# Patient Record
Sex: Female | Born: 1950 | Race: White | Hispanic: No | Smoking: Never smoker
Health system: Southern US, Community
[De-identification: ages and names within clinical notes are randomized; demographics above are authoritative.]

## PROBLEM LIST (undated history)

## (undated) DIAGNOSIS — F3289 Other specified depressive episodes: Secondary | ICD-10-CM

## (undated) DIAGNOSIS — J302 Other seasonal allergic rhinitis: Secondary | ICD-10-CM

## (undated) DIAGNOSIS — M8580 Other specified disorders of bone density and structure, unspecified site: Secondary | ICD-10-CM

## (undated) DIAGNOSIS — F329 Major depressive disorder, single episode, unspecified: Secondary | ICD-10-CM

## (undated) DIAGNOSIS — B001 Herpesviral vesicular dermatitis: Secondary | ICD-10-CM

## (undated) HISTORY — PX: BUNIONECTOMY: SHX129

## (undated) HISTORY — DX: Herpesviral vesicular dermatitis: B00.1

## (undated) HISTORY — PX: LUMBAR DISC SURGERY: SHX700

## (undated) HISTORY — DX: Other specified depressive episodes: F32.89

## (undated) HISTORY — DX: Major depressive disorder, single episode, unspecified: F32.9

## (undated) HISTORY — DX: Other seasonal allergic rhinitis: J30.2

## (undated) HISTORY — DX: Other specified disorders of bone density and structure, unspecified site: M85.80

---

## 1999-01-23 ENCOUNTER — Other Ambulatory Visit: Admission: RE | Admit: 1999-01-23 | Discharge: 1999-01-23 | Payer: Self-pay | Admitting: Obstetrics & Gynecology

## 2000-02-05 ENCOUNTER — Other Ambulatory Visit: Admission: RE | Admit: 2000-02-05 | Discharge: 2000-02-05 | Payer: Self-pay | Admitting: Obstetrics & Gynecology

## 2000-07-11 ENCOUNTER — Ambulatory Visit (HOSPITAL_COMMUNITY): Admission: RE | Admit: 2000-07-11 | Discharge: 2000-07-11 | Payer: Self-pay | Admitting: Obstetrics & Gynecology

## 2000-07-11 ENCOUNTER — Encounter (INDEPENDENT_AMBULATORY_CARE_PROVIDER_SITE_OTHER): Payer: Self-pay

## 2000-09-16 ENCOUNTER — Encounter (INDEPENDENT_AMBULATORY_CARE_PROVIDER_SITE_OTHER): Payer: Self-pay | Admitting: Specialist

## 2000-09-16 ENCOUNTER — Observation Stay (HOSPITAL_COMMUNITY): Admission: RE | Admit: 2000-09-16 | Discharge: 2000-09-17 | Payer: Self-pay | Admitting: Obstetrics & Gynecology

## 2001-04-22 ENCOUNTER — Other Ambulatory Visit: Admission: RE | Admit: 2001-04-22 | Discharge: 2001-04-22 | Payer: Self-pay | Admitting: Obstetrics & Gynecology

## 2001-10-16 ENCOUNTER — Emergency Department (HOSPITAL_COMMUNITY): Admission: EM | Admit: 2001-10-16 | Discharge: 2001-10-16 | Payer: Self-pay | Admitting: Emergency Medicine

## 2001-10-16 ENCOUNTER — Encounter: Payer: Self-pay | Admitting: Emergency Medicine

## 2002-04-28 ENCOUNTER — Other Ambulatory Visit: Admission: RE | Admit: 2002-04-28 | Discharge: 2002-04-28 | Payer: Self-pay | Admitting: Obstetrics & Gynecology

## 2002-05-13 HISTORY — PX: LAPAROSCOPIC ASSISTED VAGINAL HYSTERECTOMY: SHX5398

## 2002-05-13 HISTORY — PX: BILATERAL SALPINGOOPHORECTOMY: SHX1223

## 2003-05-09 ENCOUNTER — Other Ambulatory Visit: Admission: RE | Admit: 2003-05-09 | Discharge: 2003-05-09 | Payer: Self-pay | Admitting: Obstetrics & Gynecology

## 2004-05-01 ENCOUNTER — Other Ambulatory Visit: Admission: RE | Admit: 2004-05-01 | Discharge: 2004-05-01 | Payer: Self-pay | Admitting: Obstetrics & Gynecology

## 2004-08-24 ENCOUNTER — Encounter: Admission: RE | Admit: 2004-08-24 | Discharge: 2004-08-24 | Payer: Self-pay | Admitting: Orthopedic Surgery

## 2004-10-05 ENCOUNTER — Encounter (INDEPENDENT_AMBULATORY_CARE_PROVIDER_SITE_OTHER): Payer: Self-pay | Admitting: *Deleted

## 2004-10-05 ENCOUNTER — Observation Stay (HOSPITAL_COMMUNITY): Admission: RE | Admit: 2004-10-05 | Discharge: 2004-10-06 | Payer: Self-pay | Admitting: Orthopedic Surgery

## 2004-11-26 ENCOUNTER — Ambulatory Visit: Payer: Self-pay | Admitting: Psychology

## 2004-12-03 ENCOUNTER — Ambulatory Visit: Payer: Self-pay | Admitting: Psychology

## 2004-12-10 ENCOUNTER — Ambulatory Visit: Payer: Self-pay | Admitting: Psychology

## 2004-12-20 ENCOUNTER — Ambulatory Visit: Payer: Self-pay | Admitting: Psychology

## 2004-12-31 ENCOUNTER — Ambulatory Visit: Payer: Self-pay | Admitting: Psychology

## 2005-01-08 ENCOUNTER — Ambulatory Visit: Payer: Self-pay | Admitting: Psychology

## 2005-01-18 ENCOUNTER — Ambulatory Visit: Payer: Self-pay | Admitting: Psychology

## 2005-01-29 ENCOUNTER — Ambulatory Visit: Payer: Self-pay | Admitting: Psychology

## 2005-02-07 ENCOUNTER — Ambulatory Visit: Payer: Self-pay | Admitting: Psychology

## 2005-02-13 ENCOUNTER — Ambulatory Visit: Payer: Self-pay | Admitting: Psychology

## 2005-02-19 ENCOUNTER — Ambulatory Visit: Payer: Self-pay | Admitting: Psychology

## 2005-02-27 ENCOUNTER — Ambulatory Visit: Payer: Self-pay | Admitting: Psychology

## 2005-03-05 ENCOUNTER — Ambulatory Visit: Payer: Self-pay | Admitting: Psychology

## 2005-03-19 ENCOUNTER — Ambulatory Visit: Payer: Self-pay | Admitting: Psychology

## 2005-04-02 ENCOUNTER — Ambulatory Visit: Payer: Self-pay | Admitting: Psychology

## 2005-04-24 ENCOUNTER — Ambulatory Visit: Payer: Self-pay | Admitting: Psychology

## 2005-05-13 HISTORY — PX: BUNIONECTOMY: SHX129

## 2005-05-14 ENCOUNTER — Ambulatory Visit: Payer: Self-pay | Admitting: Psychology

## 2005-05-28 ENCOUNTER — Ambulatory Visit: Payer: Self-pay | Admitting: Psychology

## 2005-06-11 ENCOUNTER — Ambulatory Visit: Payer: Self-pay | Admitting: Psychology

## 2005-06-28 ENCOUNTER — Ambulatory Visit: Payer: Self-pay | Admitting: Psychology

## 2005-07-17 ENCOUNTER — Other Ambulatory Visit: Admission: RE | Admit: 2005-07-17 | Discharge: 2005-07-17 | Payer: Self-pay | Admitting: Obstetrics & Gynecology

## 2005-07-23 ENCOUNTER — Ambulatory Visit: Payer: Self-pay | Admitting: Psychology

## 2005-08-20 ENCOUNTER — Ambulatory Visit: Payer: Self-pay | Admitting: Psychology

## 2005-09-17 ENCOUNTER — Ambulatory Visit: Payer: Self-pay | Admitting: Psychology

## 2005-10-01 ENCOUNTER — Ambulatory Visit: Payer: Self-pay | Admitting: Psychology

## 2005-10-15 ENCOUNTER — Ambulatory Visit: Payer: Self-pay | Admitting: Psychology

## 2005-10-29 ENCOUNTER — Ambulatory Visit: Payer: Self-pay | Admitting: Psychology

## 2005-11-12 ENCOUNTER — Ambulatory Visit: Payer: Self-pay | Admitting: Psychology

## 2005-11-19 ENCOUNTER — Ambulatory Visit: Payer: Self-pay | Admitting: Psychology

## 2005-12-10 ENCOUNTER — Ambulatory Visit: Payer: Self-pay | Admitting: Psychology

## 2005-12-24 ENCOUNTER — Ambulatory Visit: Payer: Self-pay | Admitting: Psychology

## 2006-01-07 ENCOUNTER — Ambulatory Visit: Payer: Self-pay | Admitting: Psychology

## 2006-01-21 ENCOUNTER — Ambulatory Visit: Payer: Self-pay | Admitting: Psychology

## 2006-02-04 ENCOUNTER — Ambulatory Visit: Payer: Self-pay | Admitting: Psychology

## 2006-03-04 ENCOUNTER — Ambulatory Visit: Payer: Self-pay | Admitting: Psychology

## 2006-03-21 ENCOUNTER — Ambulatory Visit: Payer: Self-pay | Admitting: Psychology

## 2006-04-01 ENCOUNTER — Ambulatory Visit: Payer: Self-pay | Admitting: Psychology

## 2006-04-15 ENCOUNTER — Ambulatory Visit: Payer: Self-pay | Admitting: Psychology

## 2006-04-29 ENCOUNTER — Ambulatory Visit: Payer: Self-pay | Admitting: Psychology

## 2006-05-12 ENCOUNTER — Ambulatory Visit: Payer: Self-pay | Admitting: Psychology

## 2006-05-27 ENCOUNTER — Ambulatory Visit: Payer: Self-pay | Admitting: Psychology

## 2006-06-24 ENCOUNTER — Ambulatory Visit: Payer: Self-pay | Admitting: Psychology

## 2006-07-07 ENCOUNTER — Ambulatory Visit: Payer: Self-pay | Admitting: Psychology

## 2006-07-22 ENCOUNTER — Ambulatory Visit: Payer: Self-pay | Admitting: Psychology

## 2006-08-05 ENCOUNTER — Ambulatory Visit: Payer: Self-pay | Admitting: Psychology

## 2006-08-19 ENCOUNTER — Ambulatory Visit: Payer: Self-pay | Admitting: Psychology

## 2006-09-02 ENCOUNTER — Ambulatory Visit: Payer: Self-pay | Admitting: Psychology

## 2006-09-16 ENCOUNTER — Ambulatory Visit: Payer: Self-pay | Admitting: Psychology

## 2006-10-14 ENCOUNTER — Ambulatory Visit: Payer: Self-pay | Admitting: Psychology

## 2006-11-11 ENCOUNTER — Ambulatory Visit: Payer: Self-pay | Admitting: Psychology

## 2006-11-25 ENCOUNTER — Ambulatory Visit: Payer: Self-pay | Admitting: Psychology

## 2007-01-06 ENCOUNTER — Ambulatory Visit: Payer: Self-pay | Admitting: Psychology

## 2007-02-03 ENCOUNTER — Ambulatory Visit: Payer: Self-pay | Admitting: Psychology

## 2007-02-17 ENCOUNTER — Ambulatory Visit: Payer: Self-pay | Admitting: Psychology

## 2007-03-04 ENCOUNTER — Ambulatory Visit: Payer: Self-pay | Admitting: Psychology

## 2007-03-17 ENCOUNTER — Ambulatory Visit: Payer: Self-pay | Admitting: Psychology

## 2007-04-01 ENCOUNTER — Ambulatory Visit: Payer: Self-pay | Admitting: Psychology

## 2007-04-14 ENCOUNTER — Ambulatory Visit: Payer: Self-pay | Admitting: Psychology

## 2007-04-28 ENCOUNTER — Ambulatory Visit: Payer: Self-pay | Admitting: Psychology

## 2007-05-26 ENCOUNTER — Ambulatory Visit: Payer: Self-pay | Admitting: Psychology

## 2007-06-09 ENCOUNTER — Ambulatory Visit: Payer: Self-pay | Admitting: Psychology

## 2007-06-23 ENCOUNTER — Ambulatory Visit: Payer: Self-pay | Admitting: Psychology

## 2007-07-07 ENCOUNTER — Ambulatory Visit: Payer: Self-pay | Admitting: Psychology

## 2007-07-21 ENCOUNTER — Ambulatory Visit: Payer: Self-pay | Admitting: Psychology

## 2007-09-01 ENCOUNTER — Ambulatory Visit: Payer: Self-pay | Admitting: Psychology

## 2007-10-13 ENCOUNTER — Ambulatory Visit: Payer: Self-pay | Admitting: Psychology

## 2007-11-10 ENCOUNTER — Ambulatory Visit: Payer: Self-pay | Admitting: Psychology

## 2007-12-08 DIAGNOSIS — H6990 Unspecified Eustachian tube disorder, unspecified ear: Secondary | ICD-10-CM | POA: Insufficient documentation

## 2007-12-08 DIAGNOSIS — H9209 Otalgia, unspecified ear: Secondary | ICD-10-CM | POA: Insufficient documentation

## 2008-11-30 LAB — HM PAP SMEAR: HM Pap smear: NORMAL

## 2009-08-24 DIAGNOSIS — E559 Vitamin D deficiency, unspecified: Secondary | ICD-10-CM | POA: Insufficient documentation

## 2010-09-28 NOTE — Op Note (Signed)
[facility]  Patient:    Kara [name]                    MRN: [id number] Proc. Date: 09/16/00 Adm. 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 The patient request for definite surgical intervention.  POSTOPERATIVE DIAGNOSES:      Uterine leiomyomata with one large degenerating submucous myoma, dysfunctional uterine bleeding, menorrhagia even in the presence of oral contraceptives. The patient request for definite surgical intervention.  OPERATIVE PROCEDURE:          Laparoscopically assisted vaginal hysterectomy, bilateral salpingo-oophorectomy.  INTRAOPERATIVE COMPLICATION:  Rent in the bladder where scarring had occurred from previous cesarean deliveries.  SECONDARY PROCEDURE:          Repair of rent in bladder and cystoscopy.  SURGEON:                      Freddy Finner, M.D.  ASSISTANT:                    Juluis Mire, M.D.  ANESTHESIA:                   General endotracheal.  ESTIMATED INTRAOPERATIVE BLOOD LOSS:                   Less than or equal to 200 cc.  OTHER INTRAOPERATIVE COMPLICATIONS:                None.  INDICATIONS:                  The patient is a 60 year old white single female who has a significant history of menorrhagia, dysfunctional uterine bleeding which has persisted in spite of conservative treatment with oral contraceptives and a recent hysteroscopy, D&C. She has requested definitive surgical intervention. Is admitted at this time for laparoscopically assisted vaginal hysterectomy and bilateral salpingo-oophorectomy.  INTRAOPERATIVE FINDINGS:      Enlargement to the uterus to approximately [redacted] weeks gestational size and significant scarring of bladder to  cervix encountered on vaginal approach to the procedure. Still photographs of the intra-abdominal findings were recorded and retained in the office record, as well as photographs taken in the bladder at the time of cystoscopy.  The patient was admitted on the morning of surgery. She was placed in TAS hose. She was given a bolus of Cefotan IV preoperatively.  DESCRIPTION OF PROCEDURE:     She was taken to the operating room. There placed under adequate general endotracheal anesthesia. Placed in the dorsal lithotomy position using the [facility] stirrup system. Betadine prep of abdomen, perineum, and vagina was carried out. Bladder was evacuated with a Robinson catheter. Hulka tenaculum was attached to the cervix without difficulty. Sterile drapes were applied to the abdomen. Two small incisions were made in the abdomen, one at the umbilicus through which an 11-mm trocar was introduced while elevating the anterior abdominal wall manually. No injury was encountered on entry. Pneumoperitoneum was allowed to accumulate. A second trocar was placed through a small incision just above the symphysis at the site of the old c-section scar.  Systematic examination of abdominal and pelvic contents was carried out with no apparent abnormalities in the upper abdomen. The uterus itself was retroverted and enlarged to approximately 9 week size. With traction on the Hulka tenaculum, it was felt that adequate descensus could be accomplished to perform the procedure vaginally; and for that reason, no dissection was done through the laparoscope.  Gas was allowed to escape from the abdomen. Trocar sleeves were left in place. Attention was then turned vaginally. A posterior weighted vaginal retractor was placed. Cervix was grasped with a Christella Hartigan tenaculum. Mucosa posterior to the cervix was tented with an Allis, and the mucosa incised with Mayo scissors entering the posterior peritoneal cul-de-sac. Cervix was  circumscribed with a scalpel. The ligature system was then used to develop the uterosacral pedicles, the bladder pillar pedicles, the cardinal ligament pedicles. In the process, the bladder was carefully dissected off the cervix. After securing the cardinal ligament pedicles, further attempts were made to very carefully sharply and bluntly dissect the bladder off the cervix; and in the process of doing so, an approximately 2 cm incision was made into the base of the bladder which became immediately obvious because of spillage of urine into the field. Having identified the bladder, further careful sharp dissection was carried out to elevate the bladder off of the cervix and lower uterine segment, and the anterior peritoneal fold was entered. After accomplishing this, the hysterectomy portion was continued using the ligature clamps with vessel pedicles and a pedicle on each side above the vessels being controlled in this way and divided sharply. The uterus was then delivered through the vaginal introitus. The utero-ovarian pedicles were clamped on each side with curved Heaneys, and the uterus itself removed. The infundibulopelvic ligaments were then isolated and controlled with the ligature system, one placement on each side which was then doubly fulgurated and divided removing both the tubes and the ovaries. The bladder defect was then carefully identified and grasped with Allis clamps. A two-layer closure was then accomplished using 3-0 chromic suture on a GI needle. Cystoscopy was then performed, and the patient was given IV methylene blue. The defect in the base of the bladder which had been repaired was easily identified with the hysteroscope. Both ureteral orifices were identified, and blue dye was noted and photographed to be coming from each ureter. The bladder was then deflated, and reinspection vaginally revealed a bleb at the level of the repair which was thought to be secondary to  rupture of the second layer closure or during the cystoscopy. This was closed again using 3-0 chromic suture, and the bladder was not inflated again.  There was no leakage after a complete closure. A Silastic Foley was placed and will be left in place for approximately 10 days postoperatively. The angles of the vagina were then anchored to uterosacral pedicles with a mattress suture of 0 Monocryl. Uterosacrals were plicated. Posterior peritoneum closed with a 0 Monocryl. Cuff was closed vertically. Reinspection laparoscopically was then carried out. Minimal bleeding sources were noted along the peritoneal dissected surfaces particularly on the left side, and these were easily controlled with the bipolar Clevenger forceps which were used through the operating channel of the laparoscope. A small hematoma at the infundibulopelvic ligament on the left side was also cauterized with a bipolar forceps and was stable. There was no intra-abdominal bleeding on reduced intra-abdominal pressure after careful inspection. The procedure at this point was terminated. All the gas was allowed to escape from the abdomen. The abdominal  instruments were removed. The skin incisions were closed with interrupted subcuticular sutures of 3-0 Dexon. Steri-Strips were applied to the lower incision. The patient was awakened and taken to recovery in good condition. DD:  09/17/00 TD:  09/17/00 Job: [id number]

## 2010-09-28 NOTE — Op Note (Signed)
[facility]  Patient:    Kara [name]                      MRN: [id number] Proc. Date: 07/11/00 Attending:  Freddy Finner, M.D.                           Operative Report  PREOPERATIVE DIAGNOSIS:       1. Menorrhagia.                               2. Dysfunctional uterine bleeding.                               3. Ultrasound consistent with degenerating                                  submucous myoma.  POSTOPERATIVE DIAGNOSIS:      Small degenerating submucous myoma or                               sessile polyp.  OPERATION:                    Hysteroscopy dilatation and curettage with resection of visible mass in the upper fundus in the endometrial cavity.  SURGEON:                      Freddy Finner, M.D.  ANESTHESIA:                   Intravenous sedation.  ESTIMATED BLOOD LOSS:         Less than 20 cc.  SORBITOL DEFICIT:             50 cc.  INDICATIONS:                  The patient is a 59 year old who has had a distinctly abnormal period and who has had a long history of menorrhagia.  The uterus is palpable enlarged clinically to approximately 8-weeks size.  Pelvic ultrasound was obtained in the office in the recent past and showed an approximately 2 cm lesion consistent with degenerating myoma.  She is admitted now for hysteroscopy D&C.  INTRAOPERATIVE FINDINGS:      Recorded on still photographs.  There was what appeared to be either submucous myoma or sessile polyp of the superior endometrial surface of the upper fundus.  This was the only visible abnormality, although there was a rather lush appearing endometrium. Photographs are retained in the office record.  DESCRIPTION OF PROCEDURE:     The patient was admitted on the morning of surgery, brought to the operating room, placed under adequate intravenous sedation, placed in the dorsolithotomy position.  Sedation was adequate for complete analgesia, and no paracervical block  was placed.  The cervix was visualized using a bivalve speculum with Betadine prep and application of sterile drapes. Cervix was grasped with single-tooth tenaculum.  Uterus sounded to approximately 10 cm.  The 12.5 degree hysteroscope was introduced after dilating the cervix to a 23 with Pratts.  The lesion was visualized. After a careful inspection, gentle and thorough curettage was carried out using Heaney curet,  a sharp curet, and Randall stone forceps were used to retrieve the material from the cavity.  On reinspection, the lesion visible was absent.  It was felt that it probably represented either a thickening of the endometrium, a small sessile polyp, or a degenerating myoma which was easily curetted away. The resectoscope was not required.  The procedure at this point was terminated.  The instruments were removed.  The patient was taken to the recovery room in good condition.  She will be given routine outpatient surgical instructions. DD:  07/11/00 TD:  07/11/00 Job: [id number]

## 2010-09-28 NOTE — H&P (Signed)
[facility] [place]  Patient:    Kara [name]                    MRN: [id number] Adm. Date:  [id number] Disc. Date: [id number] Attending:  Minette Headland                         History and Physical  ADMITTING DIAGNOSES:          Uterine leiomyomata, probable degenerating submucous myoma, menorrhagia even on oral contraceptives and after hysteroscopy/dilatation and curettage done in March 2002.                                Patient is a 60 year old white single female gravida 2, para 2 by cesarean who has been followed in my office since 1999 with a history of menorrhagia when not on oral contraceptives initially.  On review of medical records from [place] her examination had been found to be normal in the past and on initial evaluation in September 1999 pelvic examination seemed to be normal to my examination.  Over the interval since that time she has continued with regular followup.  She has been maintained on oral contraceptives but the uterus has become slightly enlarged and irregular and on pelvic ultrasound in February 2002 she was found to have at least three myomas one of which seemed to abut on the endometrial cavity.  Subsequent to that examination she had hysteroscopy/D&C with what appeared to be a sessile polyp or small submucous myoma which was resected hysteroscopically.  The patient has continued to have menorrhagia and dysmenorrhea and has now requested definitive surgical intervention.  She is admitted now for laparoscopically assisted vaginal hysterectomy, bilateral salpingo-oophorectomy.  REVIEW OF SYSTEMS:            Otherwise negative.  No cardiopulmonary, GI, GU complaints.  PAST MEDICAL HISTORY:         Significant for migraine headache.  Her migraines are described as perimenstrual.  She has anxiety disorder.  She has no other known significant medical illnesses.  PAST SURGICAL HISTORY:        Two cesarean deliveries,  hysteroscopy/D&C noted above.  She has also previously had bunions excised from her feet.  ALLERGIES:                    CODEINE.  SOCIAL HISTORY:               She does not use cigarettes or alcohol.  She has never had a blood transfusion.  FAMILY HISTORY:               Noncontributory.  PHYSICAL EXAMINATION  HEENT:                        Grossly within normal limits.  NECK:                         Thyroid gland is not palpably enlarged.  CHEST:                        Clear to auscultation.  HEART:                        Normal sinus rhythm with a grade 2/6 early systolic murmur.  No rubs, gallops, or other murmurs are heard.  BREASTS:                      Considered to be normal.  No palpable masses. No nipple discharge.  No skin change.  ABDOMEN:                      Soft and nontender without appreciable organomegaly or palpable masses.  EXTREMITIES:                  Without cyanosis, clubbing, or edema.  PELVIC:                       Described above.  On most recent examination bimanual revealed the uterus to be retroverted position and 9-10 weeks size which would suggest enlargement of fibroids.  ASSESSMENT:                   Uterine leiomyomata, menorrhagia now even on oral contraceptives and after conservative surgery.  PLAN:                         Laparoscopically assisted vaginal hysterectomy, bilateral salpingo-oophorectomy. DD:  09/15/00 TD:  09/15/00 Job: [id number]

## 2010-09-28 NOTE — Op Note (Signed)
NAMEAdela [name]            ACCOUNT NO.:  [id number]   MEDICAL RECORD NO.:  [id number]          PATIENT TYPE:  OBV   LOCATION:  8119                         FACILITY:  [facility]   PHYSICIAN:  Marlowe Kays, M.D.  DATE OF BIRTH:  1950/06/16   DATE OF PROCEDURE:  10/05/2004  DATE OF DISCHARGE:                                 OPERATIVE REPORT   PREOPERATIVE DIAGNOSES:  1.  Central and lateral recess stenosis.  2.  Herniated nucleus pulposus, L4-5, right.   POSTOPERATIVE DIAGNOSES:  1.  Central and lateral recess stenosis.  2.  Herniated nucleus pulposus, L4-5, right.   OPERATION:  Decompressive microdiskectomy, L4-5, right.   SURGEON:  Marlowe Kays, M.D.   ASSISTANT:  Georges Lynch. Darrelyn Hillock, M.D.   ANESTHESIA:  General.   INDICATIONS FOR PROCEDURE:  She is having right leg pain with a monogram CT  scan demonstrating a defect at L4-5 on the right, both in the AP and lateral  projections with effacement of the L5 nerve root.  CT scan demonstrated disk  herniation, foraminal mainly, at L4-5 on the right.  Accordingly, because of  persistent pain, she is here today for the above-mentioned surgery.   PROCEDURE:  Prophylactic antibiotics.  Satisfactory general anesthesia.  Knee-chest position on the [place] frame.  The back was prepped with  DuraPrep and with three spinal needles on lateral x-ray, we tentatively  localized the L4-5 disk.  I then continued draping the back in the sterile  field.  Ioban employed.  A vertical midline incision.  The two spinous  processes, which I thought were L4-5, were isolated and tagged with Kocher  clamps.  A second lateral x-ray was taken, confirming that we were on the  spinous processes of L4-5 with the disk space close to the superior clamp.  Based on this, I continued dissecting soft tissue off the lamina of L4-5 and  removed, with double-action rongeur, a good bit of the inferior lamina of L4  to allow exposure to the interspace.  I then  completed the decompression  with a combination of 3 and 2 mm Kerrison rongeurs until we had gotten down  to the more delicate decompression.  At that point, brought in the  microscope and completed decompression, removing the ligamentum flavum and  additional bone laterally, performing wide foraminotomy.  We then were able  to get lateral to the dura and L5 nerve root and mobilize it medially.  A  large disk herniation was noted, and I opened it with a 15 knife blade.  I  then used a combination of Epstein curettes and pituitary rongeurs to remove  all disk material obtainable from the interspace.  Potential bleeders were  coagulated with bipolar cautery.  At the conclusion of the decompression,  the L5 nerve root was lying free, both laterally and in the foramen.  There  did not appear to be any free fragments of disk material, and the wound was  dry.  I irrigated the wound well with sterile saline, placed Gelfoam over  the interspace and around the L5 nerve root and over the dura.  A self-  retaining McCullough retractor was removed.  There was no unusual bleeding.  The ligamentum flavum was closed with interrupted #1 Vicryl, and  subcutaneous tissue infiltrated with 0.5% plain Marcaine.  She was given 30  mg of Toradol IV.  Closure was completed with interrupted  2-0 Vicryl to the subcutaneous tissue and staples to the skin.  Betadine  Adaptic and dry sterile dressing were applied.  She tolerated the procedure  well and was taken to the recovery room in satisfactory condition with no  known complications.      JA/MEDQ  D:  10/05/2004  T:  10/05/2004  Job:  [id number]

## 2010-09-28 NOTE — Discharge Summary (Signed)
[facility] [place]  Patient:    Kara [name]                    MRN: [id number] Adm. Date:  [id number] Disc. Date: 09/17/00 Attending:  Minette Headland                           Discharge Summary  DISCHARGE DIAGNOSES:          Uterine leiomyomata with submucous degenerating myoma, dysfunctional uterine bleeding, and menorrhagia.  OPERATIVE PROCEDURE:          Laparoscopically assisted vaginal hysterectomy, repair of rent in bladder.  CONDITION ON DISCHARGE:       Good.  ACTIVITIES:                   Progressively increasing with no vaginal entry, no heavy lifting.  INSTRUCTIONS:                 The patient is instructed to call for fever above 100; to call for any significant watery, vaginal drainage; to call for any significant amount of vaginal bleeding. She is to use sitz baths or showers at her discretion at home.  FOLLOW-UP:                    She is to return to the office in approximately 10 to 12 days for her first postoperative visit.  DISCHARGE MEDICATIONS:        1. Cenestin 1.25 mg daily.                               2. Ambien to be taken 10 mg p.o. h.s. p.r.n.                              sleep.                               3. Darvocet to be taken one or two every 6                                  hours as needed for postoperative pain.                               4. She is to use Uristat as needed for                                  postoperative urinary tract discomfort.                               5. She is to take Ceftin 250 mg b.i.d. until her                                  follow-up visit in the office in 10 to 12  days.  Details of the present illness, past history, family history, review of systems, and physical exam recorded in the admission note. The patient was admitted on the morning of surgery for the above-described operative procedure. Her physical findings on admission  were remarkable only for enlargement of the uterus. The degenerating nature of the fibroid was determined by ultrasound.  LABORATORY DATA:              During this admission includes urinalysis on admission which was normal; prothrombin time, PTT, and INR, all of which were normal on admission. Admission CBC with white count of 6.6, hemoglobin of 10.6, platelet count 242,000. Postoperative white count was 10,000, hemoglobin 9.4, platelet count 205.  HOSPITAL COURSE:              The patient was admitted on the morning of surgery. She received a gram of Cefotan IV preoperatively. She was taken to the operating room where the above-described procedure was accomplished. Following the entry into the bladder, the defect was repaired, and cystoscopy revealed adequate repair with no impingement or injury to ureters as evidenced by free spillage of indigo carmen in the urine from each ureteral orifice. Postoperatively, she remained afebrile throughout her postoperative stay. By approximately 30 hours postop, she was ambulating without difficulty. She was having adequate bowel function with positive flatus and tolerating a regular diet. She has her Foley in place with leg bag present which is to continue until her follow-up in the office. Her condition was considered to be satisfactory, and she was discharged home with disposition as noted above. DD:  09/17/00 TD:  09/17/00 Job: [id number]

## 2011-04-16 LAB — HM PAP SMEAR: HM Pap smear: NORMAL

## 2011-04-16 LAB — HM MAMMOGRAPHY: HM Mammogram: NORMAL

## 2011-07-24 ENCOUNTER — Ambulatory Visit (INDEPENDENT_AMBULATORY_CARE_PROVIDER_SITE_OTHER): Payer: 59 | Admitting: Family Medicine

## 2011-07-24 ENCOUNTER — Encounter: Payer: Self-pay | Admitting: Family Medicine

## 2011-07-24 VITALS — BP 112/82 | HR 88 | Ht 63.0 in | Wt 153.0 lb

## 2011-07-24 DIAGNOSIS — M79609 Pain in unspecified limb: Secondary | ICD-10-CM

## 2011-07-24 DIAGNOSIS — R209 Unspecified disturbances of skin sensation: Secondary | ICD-10-CM

## 2011-07-24 DIAGNOSIS — F411 Generalized anxiety disorder: Secondary | ICD-10-CM

## 2011-07-24 NOTE — Progress Notes (Signed)
Patient presents to establish care with chief complaint of her L pointer finger--tip turns ice cold and tingles.  Denies any color change.  This morning, tingling was very intense, and was very cold.  Also feels pressure in front of her elbow/antecubital fossa.  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  Denies any recent problem with carpal tunnel.  Not related to position or activity.  She is left handed, and on computer all day. Denies any weakness, color change.  She reports being under a lot of work stress.  She is on Cymbalta from her GYN, since her hysterectomy. Moods overall are okay.  Wants to come down on her Cymbalta dose to 30mg  (she reports it was increased to 60 mg in error--given wrong samples). Gets through Julio Sicks at her GYN's office.  Has been on 60 mg for at least 4-5 months, feels a little dizzy.  Some trouble sleeping, 2-3 times/week, gets up every hour, thinking about work.  Admits to not exercising as much over the winter, but is back to walking 3 miles every other day.  Uses xanax 1-2x/week over the last 3 weeks, when she feels like there is an elephant sitting on her chest.  She denies any exertional chest pain or shortness of breath.  Past Medical History  Diagnosis Date  . Depressive disorder, not elsewhere classified     situational (divorce, work stress)  . Seasonal allergies   . Herpes simplex labialis     Past Surgical History  Procedure Date  . Laparoscopic assisted vaginal hysterectomy 2004  . Bilateral salpingoophorectomy 2004    done with hysterectomy  . Cesarean section x2  . Lumbar disc surgery     History   Social History  . Marital Status: Divorced    Spouse Name: N/A    Number of Children: 2  . Years of Education: N/A   Occupational History  . contracting (for physicians) [employer]   Social History Main Topics  . Smoking status: Never Smoker   . Smokeless tobacco: Never Used  . Alcohol Use: Yes     2 glasses of wine per evening.  .  Drug Use: No  . Sexually Active: Not on file   Other Topics Concern  . Not on file   Social History Narrative   Divorced.  Son is a paramedic in [employer].  Daughter and her 2 children are currently living with her    Family History  Problem Relation Age of Onset  . Heart disease Mother     diagnosed in her 73's.  s/p CABG  . Cancer Mother 57    breast cancer  . Alcohol abuse Father   . Diabetes Father     refused to be treated  . COPD Sister   . Diabetes Sister   . Diabetes Brother   . Cerebral aneurysm Maternal Aunt   . Heart disease Maternal Uncle   . Cancer Maternal Grandmother     lymphoma  . Heart disease Maternal Grandfather   . Diabetes Brother   . Alcohol abuse Brother     stopped when diagnosed with DM    Current outpatient prescriptions:ALPRAZolam (XANAX) 0.25 MG tablet, Take 0.25 mg by mouth as needed., Disp: , Rfl: ;  DULoxetine (CYMBALTA) 60 MG capsule, Take 60 mg by mouth daily., Disp: , Rfl:   Allergies  Allergen Reactions  . Codeine Other (See Comments)    Chills,vomiting and hallucinations.  . Lyrica Other (See Comments)    Hallucinations.  ROS:  Denies fevers, URI symptoms, cough, shortness of breath, nausea, vomiting, stool changes, urinary symptoms, skin lesions or rashes, headaches, dizziness or other concerns.  See HPI  PHYSICAL EXAM BP 112/82  Pulse 88  Ht 5\' 3"  (1.6 m)  Wt 153 lb (69.4 kg)  BMI 27.10 kg/m2 Well developed, pleasant female in no distress HEENT: PERRL, conjunctiva clear Neck: no lymphadenopathy or thyromegaly Heart: regular rate and rhythm without murmur Lungs: clear bilaterally Abdomen: soft, nontender, no mass Extremities: no clubbing, cyanosis or edema, 2+ pulses.  Brisk capillary refill in fingertips.  Negative Tinel.  Normal strength, sensation Skin: no rash Psych: normal mood, affect, hygiene and grooming  ASSESSMENT/PLAN: 1. Paresthesia and pain of extremity   2. Anxiety state, unspecified    Tingling and cold  sensation in L index fingertip--most likely due to carpal tunnel syndrome.  Doubt Raynaud's phenomenon due to lack of color change.  Reassured no evidence of circulatory problem.  Trial of wrist brace and NSAID.  F/u if symptoms persist, worsen  Anxiety--given current work stress, recommended that she continue the Cymbalta, rather than decreasing dose.  If ongoing regular need for xanax, consider increasing dose.  Discussed stress reduction, exercise.  Recommended cutting back to just one glass of wine daily--risks reviewed.  Schedule CPE.  Recommended checking Zostavax coverage, and likely will need TdaP.  Will get records released

## 2011-07-24 NOTE — Patient Instructions (Signed)
Cut back on alcohol to 1 glass daily.  Try to increase exercise and work on Brewing technologist.  Consider yoga, pilates.  Get previous records, and set up physical.  Check on insurance coverage for Zostavax (shingles vaccine) and plan to get TdaP (tetanus booster with pertussis)

## 2011-08-22 ENCOUNTER — Telehealth: Payer: Self-pay | Admitting: Family Medicine

## 2011-08-22 NOTE — Telephone Encounter (Signed)
Pt called around 11:00ish, she told Olegario Messier she had a mental breakdown and work and wanted to see someone.  I went and discussed with Dr. Lynelle Doctor as Dr Delford Field schedule was full.  I questioned pt did they have any employee assistance programs or therepist and she said no.  I advised her both docs schedules were full we could see her tomorrow or she could go the [facility] as they have a behavioral health er.  I also advised her if I had any cancellations we would call her back.

## 2011-08-23 ENCOUNTER — Encounter: Payer: Self-pay | Admitting: Medical

## 2011-08-23 ENCOUNTER — Ambulatory Visit (INDEPENDENT_AMBULATORY_CARE_PROVIDER_SITE_OTHER): Payer: 59 | Admitting: Medical

## 2011-08-23 VITALS — BP 142/90 | HR 88 | Temp 98.7°F | Resp 16 | Wt 152.0 lb

## 2011-08-23 DIAGNOSIS — K2970 Gastritis, unspecified, without bleeding: Secondary | ICD-10-CM

## 2011-08-23 DIAGNOSIS — F430 Acute stress reaction: Secondary | ICD-10-CM

## 2011-08-23 DIAGNOSIS — F410 Panic disorder [episodic paroxysmal anxiety] without agoraphobia: Secondary | ICD-10-CM

## 2011-08-23 DIAGNOSIS — K2990 Gastroduodenitis, unspecified, without bleeding: Secondary | ICD-10-CM

## 2011-08-23 DIAGNOSIS — F4389 Other reactions to severe stress: Secondary | ICD-10-CM

## 2011-08-23 DIAGNOSIS — F438 Other reactions to severe stress: Secondary | ICD-10-CM

## 2011-08-23 MED ORDER — SERTRALINE HCL 25 MG PO TABS: ORAL_TABLET | ORAL | Status: DC

## 2011-08-23 MED ORDER — OMEPRAZOLE 20 MG PO CPDR: 20.0000 mg | DELAYED_RELEASE_CAPSULE | Freq: Every day | ORAL | Status: DC

## 2011-08-23 NOTE — Patient Instructions (Signed)
Anxiety We are going to have you wean off the Cymbalta as follows:  Start taking Cymbalta 30mg  samples, daily for 5 days.  After 5 days, take Cymbalta 30mg  every other day for a week, then stop.  Simultaneously, begin Zoloft 25mg , 1/2 tablet daily for the first week, then go to 1 whole table daily after 1 week.   Stomach pain Begin Prilosec OTC or samples, 45 minutes before breakfast.    Lets have you follow up here in 2 weeks for recheck.    Anxiety and Panic Attacks Your caregiver has informed you that you are having an anxiety or panic attack. There may be many forms of this. Most of the time these attacks come suddenly and without warning. They come at any time of day, including periods of sleep, and at any time of life. They may be strong and unexplained. Although panic attacks are very scary, they are physically harmless. Sometimes the cause of your anxiety is not known. Anxiety is a protective mechanism of the body in its fight or flight mechanism. Most of these perceived danger situations are actually nonphysical situations (such as anxiety over losing a job). CAUSES  The causes of an anxiety or panic attack are many. Panic attacks may occur in otherwise healthy people given a certain set of circumstances. There may be a genetic cause for panic attacks. Some medications may also have anxiety as a side effect. SYMPTOMS  Some of the most common feelings are:  Intense terror.   Dizziness, feeling faint.   Hot and cold flashes.   Fear of going crazy.   Feelings that nothing is real.   Sweating.   Shaking.   Chest pain or a fast heartbeat (palpitations).   Smothering, choking sensations.   Feelings of impending doom and that death is near.   Tingling of extremities, this may be from over-breathing.   Altered reality (derealization).   Being detached from yourself (depersonalization).  Several symptoms can be present to make up anxiety or panic attacks. DIAGNOSIS    The evaluation by your caregiver will depend on the type of symptoms you are experiencing. The diagnosis of anxiety or panic attack is made when no physical illness can be determined to be a cause of the symptoms. TREATMENT  Treatment to prevent anxiety and panic attacks may include:  Avoidance of circumstances that cause anxiety.   Reassurance and relaxation.   Regular exercise.   Relaxation therapies, such as yoga.   Psychotherapy with a psychiatrist or therapist.   Avoidance of caffeine, alcohol and illegal drugs.   Prescribed medication.  SEEK IMMEDIATE MEDICAL CARE IF:   You experience panic attack symptoms that are different than your usual symptoms.   You have any worsening or concerning symptoms.  Document Released: 04/29/2005 Document Revised: 01/09/2011 Document Reviewed: 08/31/2009 [facility] Patient Information 2012 [place], [place].    Stroke (Cerebrovascular Accident)  SIGNS AND SYMPTOMS OF STROKE These symptoms usually develop suddenly (or may be newly present upon awakening from sleep):  Sudden weakness or numbness of the face, arm, or leg, especially on one side of the body.   Sudden confusion.   Trouble speaking (aphasia) or understanding.   Sudden trouble seeing in one or both eyes.   Sudden trouble walking.   Dizziness.   Loss of balance or coordination.   Sudden severe headache with no known cause.   SEEK IMMEDIATE MEDICAL CARE IF:   You have sudden weakness or numbness of the face, arm, or leg, especially on  one side of the body.   You have sudden confusion.   You have trouble speaking or understanding.   You have sudden trouble seeing in one or both eyes.   You have sudden trouble walking.   You have dizziness.   You have a loss of balance or coordination.   You have a sudden severe headache with no known cause.   You have a fever.   You are coughing or have difficulty breathing.   You have new chest pain, angina, or an  irregular heartbeat.  ANY OF THESE SYMPTOMS MAY REPRESENT A SERIOUS PROBLEM THAT IS AN EMERGENCY. Do not wait to see if the symptoms will go away. Get medical help right away. Call your local emergency services (911 in [place] DO NOT drive yourself to the hospital.  TREATMENT  TIME IS OF THE ESSENCE! It is important to seek treatment within 4 hours of the start of symptoms because you may receive a "clot dissolving" medication that cannot be given after that time. Even if you don't know when your symptoms began, get treatment as soon as possible.    WHAT IS A STROKE: A stroke is the sudden death of brain tissue. It is a medical emergency. A stroke can cause permanent loss of brain function. This can cause problems with different parts of your body. A TIA (transient ischemic attack) is different because it does not cause permanent damage. A TIA is a short-lived problem of poor blood flow affecting a part of the nervous system. TIA is also a serious problem because having a TIA greatly increases the chances of having a stroke. When symptoms first develop, you cannot know if the problem might be a stroke or TIA. CAUSES  A stroke is caused by a decrease of oxygen supply to an area of your brain. It is usually the result of a small blood clot or the arteries hardening. A stroke can also be caused by blocked or damaged carotid arteries. Bleeding in the brain can cause, or accompany, a stroke.  RISK FACTORS  High blood pressure (hypertension).   High cholesterol.   Diabetes.   Heart disease.   The buildup of fatty deposits in the blood vessels (peripheral artery disease or atherosclerosis).   An abnormal heart rhythm (atrial fibrillation).   Obesity.   Smoking.   Taking oral contraceptives (especially in combination with smoking).   Physical inactivity.   A diet high in fats, salt (sodium), and calories.   Alcohol use.   Use of illegal drugs (especially cocaine and methamphetamine).     Being a female.   Being an Tree surgeon.   Age over 50.   Family history of stroke.   Previous history of blood clots, a "warning stroke" (transient ischemic attack, TIA), or heart attack.   Sickle cell disease.

## 2011-08-23 NOTE — Progress Notes (Signed)
Subjective: Here for concerns.  She normally sees Dr. Lynelle Doctor here, and in the past has been treated with Zoloft and Cymbalta through her gynecologist.  She tried to get in with both this week, but scheduling didn't permit, so she is here to see me today.  She notes that she has been going through rough time at work with Production designer, theatre/television/film at work.   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  About a year ago her current manager gave her a review showing "needs improvement."  Since that time she has used the suggestions of her manager to improve although she felt as though she has done a great job.  She had asked for a 43mo review for followup on her improvements but never got the review.   Then on 03/2011, she was placed on a "corrective action" plan much to her amazement.  This really started to stress her as she felt that her supervisor had it out for her, wasn't acknowledging the improvements she had made despite doing what the supervisor asked, and still hadn't been given the review.   She felt as though the supervisor is making up negative job performance comments about her despite being given fair review.   She has went to HR and the VP about this issue, and basically feels like the 2 sides won't work together - HR,VP verus her Merchandiser, retail.  They have acknowledged that her direct supervisor has the authority to fire her or move her to a different dept, although Mrs. Hinderer has been unable to get the supervisor to hear her out or make any changes on her behalf.  Thinks really came to breaking point last week when again to her surprise she was given a "final warning" on her job performance.  She really thinks issue has been either from her impression that her supervisor either has her select group of folks she favors or either is trying to make her job miserable as a way to force her out of the company prematurely.  She has been  there 14 years and wants to continue working there, but feels hostage to her supervisor.    When she was given the "final warning," she had to leave and go to the parking lot as she was having a panic attack.  She also felt her stomach on fire, felt a knot in the epigastric area, pain in her side x 10 days, burning like stomach lining on fire, feels like she is constipated.   She called her supervisor from her car and advise on how this stress had been affecting her psyche but now its affection her physically too and she can't live this way.  She said the supervisor told her to go see a psychiatrist and get help.  She since awakes clutching her self, pressure in top of head, and Wednesday felt her face draw up like it wanted to become numb.   This scared her.  She is here for help on the anxiety and panic as well as the new physical manifestations. She notes being put on Zoloft after prior hysterectomy and did really well on it. She wants to come off Cymbalta as it doesn't seem to help in any way.  She has used xanax prior for panic attack but this sedates her and she doesn't like taking it.  She notes documenting the last years' worth of situations at work that  she feels is unfair.  She is now afraid she will need to seek an attorney as a last resort as she can't get her supervisor or the higher ups to help sort the issues out or give her a review.  Past Medical History  Diagnosis Date  . Depressive disorder, not elsewhere classified     situational (divorce, work stress)  . Seasonal allergies   . Herpes simplex labialis    ROS as noted in HPI   Objective:   Physical Exam  Filed Vitals:   08/23/11 1340  BP: 142/90  Pulse: 88  Temp: 98.7 F (37.1 C)  Resp: 16    General appearance: alert, no distress, WD/WN, crying at times HEENT: normocephalic, sclerae anicteric, TMs pearly, nares patent, no discharge or erythema, pharynx normal Oral cavity: MMM, no lesions Neck: supple, no  lymphadenopathy, no thyromegaly, no masses Heart: RRR, normal S1, S2, no murmurs Lungs: CTA bilaterally, no wheezes, rhonchi, or rales Abdomen: +bs, soft, non tender, non distended, no masses, no hepatomegaly, no splenomegaly Pulses: 2+ symmetric Neuro: CN2-12 intact, normal strength and sensation, DTrs normal, non focal exam   Assessment and Plan :     Encounter Diagnoses  Name Primary?  . Acute stress reaction Yes  . Gastritis   . Panic attack    Discussed her concerns, counseled on strategies to deal with both panic, anxiety, and dealing with her supervisors and these issues.  She reports no benefit from Cymbalta.  Will wean off Cymbalta and begin Zoloft low dose.  Gave her instructions on how to wean off cymbalta and starting Zoloft.  Can c/t Xanax prn.  Recheck 2wk.  Gastritis - likely has early stress ulcer formation.  Begin Prilosec 45 min prior to meal, samples given.  Panic attack - discussed deep breathing, self reflecting, medications.  Advised of signs of stroke that would prompt 911 call.  Follow-up 2wk with Dr. Lynelle Doctor

## 2011-08-24 ENCOUNTER — Encounter: Payer: Self-pay | Admitting: Medical

## 2011-08-29 ENCOUNTER — Encounter: Payer: 59 | Admitting: Family Medicine

## 2011-09-04 ENCOUNTER — Encounter: Payer: Self-pay | Admitting: *Deleted

## 2011-09-04 ENCOUNTER — Encounter: Payer: Self-pay | Admitting: Family Medicine

## 2011-09-04 ENCOUNTER — Ambulatory Visit (INDEPENDENT_AMBULATORY_CARE_PROVIDER_SITE_OTHER): Payer: 59 | Admitting: Family Medicine

## 2011-09-04 VITALS — BP 146/78 | HR 84 | Ht 63.0 in | Wt 155.0 lb

## 2011-09-04 DIAGNOSIS — R5381 Other malaise: Secondary | ICD-10-CM

## 2011-09-04 DIAGNOSIS — Z0000 Encounter for general adult medical examination without abnormal findings: Secondary | ICD-10-CM

## 2011-09-04 DIAGNOSIS — R5383 Other fatigue: Secondary | ICD-10-CM

## 2011-09-04 DIAGNOSIS — E7800 Pure hypercholesterolemia, unspecified: Secondary | ICD-10-CM

## 2011-09-04 DIAGNOSIS — E559 Vitamin D deficiency, unspecified: Secondary | ICD-10-CM

## 2011-09-04 DIAGNOSIS — Z23 Encounter for immunization: Secondary | ICD-10-CM

## 2011-09-04 DIAGNOSIS — F411 Generalized anxiety disorder: Secondary | ICD-10-CM | POA: Insufficient documentation

## 2011-09-04 LAB — TSH: TSH: 1.199 u[IU]/mL (ref 0.350–4.500)

## 2011-09-04 LAB — POCT URINALYSIS DIPSTICK
Bilirubin, UA: NEGATIVE
Leukocytes, UA: NEGATIVE
Nitrite, UA: NEGATIVE
Protein, UA: NEGATIVE
Spec Grav, UA: 1.015

## 2011-09-04 LAB — LIPID PANEL
HDL: 76 mg/dL (ref >39)
LDL Cholesterol: 149 mg/dL — ABNORMAL HIGH (ref 0–99)
Total CHOL/HDL Ratio: 3.3 Ratio

## 2011-09-04 LAB — COMPREHENSIVE METABOLIC PANEL
BUN: 17 mg/dL (ref 6–23)
CO2: 27 mEq/L (ref 19–32)
Glucose, Bld: 88 mg/dL (ref 70–99)
Total Protein: 7.1 g/dL (ref 6.0–8.3)

## 2011-09-04 LAB — CBC WITH DIFFERENTIAL/PLATELET
Basophils Absolute: 0 10*3/uL (ref 0.0–0.1)
Basophils Relative: 0 % (ref 0–1)
MCHC: 33.2 g/dL (ref 30.0–36.0)
RBC: 4.36 MIL/uL (ref 3.87–5.11)
RDW: 13.4 % (ref 11.5–15.5)
WBC: 4.7 10*3/uL (ref 4.0–10.5)

## 2011-09-04 MED ORDER — SERTRALINE HCL 50 MG PO TABS: 50.0000 mg | ORAL_TABLET | Freq: Every day | ORAL | Status: DC

## 2011-09-04 NOTE — Patient Instructions (Signed)
HEALTH MAINTENANCE RECOMMENDATIONS:  It is recommended that you get at least 30 minutes of aerobic exercise at least 5 days/week (for weight loss, you may need as much as 60-90 minutes). This can be any activity that gets your heart rate up. This can be divided in 10-15 minute intervals if needed, but try and build up your endurance at least once a week.  Weight bearing exercise is also recommended twice weekly.  Eat a healthy diet with lots of vegetables, fruits and fiber.  "Colorful" foods have a lot of vitamins (ie green vegetables, tomatoes, red peppers, etc).  Limit sweet tea, regular sodas and alcoholic beverages, all of which has a lot of calories and sugar.  Up to 1 alcoholic drink daily may be beneficial for women (unless trying to lose weight, watch sugars).  Drink a lot of water.  Calcium recommendations are 1200-1500 mg daily (1500 mg for postmenopausal women or women without ovaries), and vitamin D 1000 IU daily.  This should be obtained from diet and/or supplements (vitamins), and calcium should not be taken all at once, but in divided doses.  Monthly self breast exams and yearly mammograms for women over the age of 65 is recommended.  Sunscreen of at least SPF 30 should be used on all sun-exposed parts of the skin when outside between the hours of 10 am and 4 pm (not just when at beach or pool, but even with exercise, golf, tennis, and yard work!)  Use a sunscreen that says "broad spectrum" so it covers both UVA and UVB rays, and make sure to reapply every 1-2 hours.  Remember to change the batteries in your smoke detectors when changing your clock times in the spring and fall.  Use your seat belt every time you are in a car, and please drive safely and not be distracted with cell phones and texting while driving.  Please schedule your colonoscopy with Dr. Jarold Motto. Check with your insurance regarding coverage of shingles vaccine (zostavax).  If covered, please call and schedule  nurse visit.

## 2011-09-04 NOTE — Progress Notes (Signed)
Kara [name] is a 61 y.o. female who presents for a complete physical.  She has the following concerns:  She is here to follow up on stress/anxiety.  She saw Vincenza Hews 2 weeks ago, and meds were changed.  She took last cymbalta 4 days ago, and is now up to full tablet of zoloft (25mg ).  She didn't like how she felt on the Cymbalta (including the "zings" she would feel if she forgot to take it).  Took Zoloft during divorce in 2005-2007, and recalls only being on 25mg  back then, recalls doing better on the low dose of that medication than the higher dose of Cymbalta.  She is having a lot of stress and anxiety related to work.  Had a meeting with her manager today, so BP is up now, but seemed to handle it better than she might have 2 weeks ago.  2 weeks ago, she couldn't even go into work, due to crying. Today, she could control her emotions a little better while speaking to her, but heart was racing.  Raspiness in her voice is related to her nerves, doesn't sound like this on the weekends.  Sporadic trouble sleeping, sometimes will wake up every hour, or wakes up hugging herself.   Took prilosec, and it seems to help with the upper abdominal pressure she was getting after work, related to her stress.  Health Maintenance: There is no immunization history on file for this patient. Doesn't recall last tetanus, possibly >10 years ago, none recently. Gets flu shots at work. Last Pap smear: 04/16/11 Last mammogram: yearly, 04/16/11 Last colonoscopy: age 83, 10 years ago Last DEXA:  01/2010 thru [facility] Dentist: twice yearly Ophtho: within 6 months Exercise:  Very little, due to fatigue.  Past Medical History  Diagnosis Date  . Depressive disorder, not elsewhere classified     situational (divorce, work stress)  . Seasonal allergies   . Herpes simplex labialis   . Osteopenia     Past Surgical History  Procedure Date  . Laparoscopic assisted vaginal hysterectomy 2004  . Bilateral  salpingoophorectomy 2004    done with hysterectomy  . Lumbar disc surgery   . Cesarean section x2    1982 & 1988  . Bunionectomy 2007    and hammertoe surgery L  . Bunionectomy     R in [place], [place]    History   Social History  . Marital Status: Divorced    Spouse Name: N/A    Number of Children: 2  . Years of Education: N/A   Occupational History  . contracting (for physicians) [employer]   Social History Main Topics  . Smoking status: Never Smoker   . Smokeless tobacco: Never Used  . Alcohol Use: Yes     2 glasses of wine per evening.  . Drug Use: No  . Sexually Active: Not on file   Other Topics Concern  . Not on file   Social History Narrative   Divorced.  Son is a paramedic in [place].  Daughter and her 2 children are currently living with her    Family History  Problem Relation Age of Onset  . Heart disease Mother     diagnosed in her 43's.  s/p CABG  . Cancer Mother 51    breast cancer  . Alcohol abuse Father   . Diabetes Father     refused to be treated  . COPD Sister   . Diabetes Sister   . Diabetes Brother   . Cerebral  aneurysm Maternal Aunt   . Heart disease Maternal Uncle   . Cancer Maternal Grandmother     lymphoma  . Heart disease Maternal Grandfather   . Diabetes Brother   . Alcohol abuse Brother     stopped when diagnosed with DM    Current outpatient prescriptions:ALPRAZolam (XANAX) 0.25 MG tablet, Take 0.25 mg by mouth as needed., Disp: , Rfl: ;  omeprazole (PRILOSEC) 20 MG capsule, Take 1 capsule (20 mg total) by mouth daily., Disp: 15 capsule, Rfl: 0;  sertraline (ZOLOFT) 50 MG tablet, Take 1 tablet (50 mg total) by mouth at bedtime., Disp: 30 tablet, Rfl: 2;  DISCONTD: sertraline (ZOLOFT) 25 MG tablet, 1/2 tablet daily x 1 wk, then 1 tablet daily, Disp: 30 tablet, Rfl: 2  Allergies  Allergen Reactions  . Codeine Other (See Comments)    Chills,vomiting and hallucinations.  . Lyrica Other (See Comments)    Hallucinations.   ROS:   The patient denies anorexia, fever, weight changes, headaches,  vision changes, decreased hearing, ear pain, sore throat, breast concerns, chest pain, dizziness, syncope, dyspnea on exertion, cough, swelling, nausea, vomiting, diarrhea, constipation, melena, hematochezia, indigestion/heartburn, hematuria (she recalls being told many times in past that she has some blood in her urine), incontinence, dysuria, vaginal bleeding, discharge, odor or itch, genital lesions, joint pains, numbness, tingling, weakness, tremor, suspicious skin lesions, abnormal bleeding/bruising, or enlarged lymph nodes. +palpitations when stressed, pain at upper stomach/lower chest while at work, improved with prilosec.  Sometimes feels bubbles in her lower throat, when anxious Still occasionally L index finger still gets cold (see initial visit). +back and hand/finger pain, especially L hand.  PHYSICAL EXAM:  BP 150/86  Pulse 84  Ht 5\' 3"  (1.6 m)  Wt 155 lb (70.308 kg)  BMI 27.46 kg/m2 146/78 on repeat by MD General Appearance:    Alert, cooperative, no distress, appears stated age  Head:    Normocephalic, without obvious abnormality, atraumatic  Eyes:    PERRL, conjunctiva/corneas clear, EOM's intact, fundi    benign  Ears:    Normal TM's and external ear canals  Nose:   Nares normal, mucosa normal, no drainage or sinus   tenderness  Throat:   Lips, mucosa, and tongue normal; teeth and gums normal  Neck:   Supple, no lymphadenopathy;  thyroid:  no   enlargement/tenderness/nodules; no carotid   bruit or JVD  Back:    Spine nontender, no curvature, ROM normal, no CVA     tenderness  Lungs:     Clear to auscultation bilaterally without wheezes, rales or     ronchi; respirations unlabored  Chest Wall:    No tenderness or deformity   Heart:    Regular rate and rhythm, S1 and S2 normal, no murmur, rub   or gallop  Breast Exam:    Deferred to GYN  Abdomen:     Soft, non-tender, nondistended, normoactive bowel sounds,     no masses, no hepatosplenomegaly  Genitalia:    Deferred to GYN     Extremities:   No clubbing, cyanosis or edema  Pulses:   2+ and symmetric all extremities  Skin:   Skin color, texture, turgor normal, no rashes or lesions  Lymph nodes:   Cervical, supraclavicular, and axillary nodes normal  Neurologic:   CNII-XII intact, normal strength, sensation and gait; reflexes 2+ and symmetric throughout          Psych:   Normal mood, affect, hygiene and grooming.     ASSESSMENT/PLAN:  1. Routine general medical examination at a health care facility  POCT Urinalysis Dipstick, Visual acuity screening  2. Pure hypercholesterolemia  Lipid panel  3. Unspecified vitamin D deficiency  Vitamin D 25 hydroxy  4. Other malaise and fatigue  Comprehensive metabolic panel, CBC with Differential, Vitamin D 25 hydroxy, TSH  5. Anxiety state, unspecified  sertraline (ZOLOFT) 50 MG tablet  6. Need for Tdap vaccination  Tdap vaccine greater than or equal to 7yo IM    Discussed monthly self breast exams and yearly mammograms after the age of 61; at least 30 minutes of aerobic activity at least 5 days/week; proper sunscreen use reviewed; healthy diet, including goals of calcium and vitamin D intake and alcohol recommendations (less than or equal to 1 drink/day) reviewed; regular seatbelt use; changing batteries in smoke detectors.  Immunization recommendations discussed. Risks/benefits of zostavax reviewed--will check with her insurance.  TdaP today.  Flu shots are recommended yearly. Colonoscopy recommendations reviewed--due now.  Pt to schedule with Dr. Jarold Motto. Hemoccult kit given.  Fatigue--related to stress, lack of exercise, and possibly recurrent vitamin D deficiency.  Increase to 50 mg of zoloft next week, change to taking at bedtime if contributes to fatigue/sedating.  Given her sensitivity to low doses in the past, will keep at 50mg  for a while, rather than increasing to 100mg .  Consider increasing to 100mg  in  4-6 weeks, if not at goal with treatment at 50 mg.  F/u 6 weeks, sooner prn

## 2011-09-05 ENCOUNTER — Telehealth: Payer: Self-pay | Admitting: Family Medicine

## 2011-09-05 LAB — VITAMIN D 25 HYDROXY (VIT D DEFICIENCY, FRACTURES): Vit D, 25-Hydroxy: 24 ng/mL — ABNORMAL LOW (ref 30–89)

## 2011-09-05 MED ORDER — VITAMIN D (ERGOCALCIFEROL) 1.25 MG (50000 UNIT) PO CAPS: 50000.0000 [IU] | ORAL_CAPSULE | ORAL | Status: DC

## 2011-09-05 NOTE — Telephone Encounter (Signed)
Patient forgot to ask you , what ideal body weight you would like to see her at for her build

## 2011-09-05 NOTE — Telephone Encounter (Signed)
145 is a good goal to start with--then need to evaluate waist circumference to determine true goal (per BMI, goal is 140, but women with large breasts, lots of muscles, larger frame/build can't always achieve BMI of 25)

## 2011-09-05 NOTE — Telephone Encounter (Signed)
Pt informed of Dr.Knapp's response.

## 2011-11-13 ENCOUNTER — Emergency Department (HOSPITAL_COMMUNITY)
Admission: EM | Admit: 2011-11-13 | Discharge: 2011-11-13 | Disposition: A | Discharge status: Home / Self Care | Payer: No Typology Code available for payment source | Attending: Emergency Medicine | Admitting: Emergency Medicine

## 2011-11-13 ENCOUNTER — Emergency Department (HOSPITAL_COMMUNITY): Payer: No Typology Code available for payment source

## 2011-11-13 ENCOUNTER — Encounter (HOSPITAL_COMMUNITY): Payer: Self-pay | Admitting: *Deleted

## 2011-11-13 DIAGNOSIS — F329 Major depressive disorder, single episode, unspecified: Secondary | ICD-10-CM | POA: Insufficient documentation

## 2011-11-13 DIAGNOSIS — C159 Malignant neoplasm of esophagus, unspecified: Secondary | ICD-10-CM | POA: Insufficient documentation

## 2011-11-13 DIAGNOSIS — H539 Unspecified visual disturbance: Secondary | ICD-10-CM | POA: Insufficient documentation

## 2011-11-13 DIAGNOSIS — F3289 Other specified depressive episodes: Secondary | ICD-10-CM | POA: Insufficient documentation

## 2011-11-13 DIAGNOSIS — R51 Headache: Secondary | ICD-10-CM | POA: Insufficient documentation

## 2011-11-13 MED ORDER — DIAZEPAM 5 MG PO TABS: 5.0000 mg | ORAL_TABLET | Freq: Two times a day (BID) | ORAL | Status: AC

## 2011-11-13 MED ORDER — IBUPROFEN 800 MG PO TABS: 800.0000 mg | ORAL_TABLET | Freq: Three times a day (TID) | ORAL | Status: AC
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
## 2011-11-13 NOTE — ED Notes (Signed)
PT. TRANSPORTED TO CT SCAN. 
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
## 2011-11-18 NOTE — ED Provider Notes (Signed)
Medical screening examination/treatment/procedure(s) were performed by non-physician practitioner and as supervising physician I was immediately available for consultation/collaboration.  Karuna Balducci, MD 11/18/11 0722 

## 2013-02-22 ENCOUNTER — Encounter: Payer: Self-pay | Admitting: Gastroenterology

## 2013-03-03 ENCOUNTER — Other Ambulatory Visit: Payer: Self-pay | Admitting: Obstetrics & Gynecology

## 2013-03-03 DIAGNOSIS — R928 Other abnormal and inconclusive findings on diagnostic imaging of breast: Secondary | ICD-10-CM

## 2013-03-19 ENCOUNTER — Ambulatory Visit
Admission: RE | Admit: 2013-03-19 | Discharge: 2013-03-19 | Disposition: A | Discharge status: Home / Self Care | Payer: No Typology Code available for payment source | Source: Ambulatory Visit | Attending: Obstetrics & Gynecology | Admitting: Obstetrics & Gynecology

## 2013-03-19 DIAGNOSIS — R928 Other abnormal and inconclusive findings on diagnostic imaging of breast: Secondary | ICD-10-CM

## 2013-03-24 ENCOUNTER — Encounter: Payer: Self-pay | Admitting: Gastroenterology

## 2014-08-19 ENCOUNTER — Other Ambulatory Visit: Payer: Self-pay | Admitting: Obstetrics and Gynecology

## 2014-08-22 LAB — CYTOLOGY - PAP

## 2015-09-05 ENCOUNTER — Other Ambulatory Visit: Payer: Self-pay | Admitting: Family Medicine

## 2015-09-05 DIAGNOSIS — R7989 Other specified abnormal findings of blood chemistry: Secondary | ICD-10-CM

## 2015-09-05 DIAGNOSIS — R945 Abnormal results of liver function studies: Principal | ICD-10-CM

## 2015-09-15 ENCOUNTER — Ambulatory Visit
Admission: RE | Admit: 2015-09-15 | Discharge: 2015-09-15 | Disposition: A | Discharge status: Home / Self Care | Payer: No Typology Code available for payment source | Source: Ambulatory Visit | Attending: Family Medicine | Admitting: Family Medicine

## 2015-09-15 DIAGNOSIS — R945 Abnormal results of liver function studies: Principal | ICD-10-CM

## 2015-09-15 DIAGNOSIS — R7989 Other specified abnormal findings of blood chemistry: Secondary | ICD-10-CM

## 2016-07-03 DIAGNOSIS — M2550 Pain in unspecified joint: Secondary | ICD-10-CM | POA: Diagnosis not present

## 2016-07-03 DIAGNOSIS — I10 Essential (primary) hypertension: Secondary | ICD-10-CM | POA: Diagnosis not present

## 2016-07-03 DIAGNOSIS — R198 Other specified symptoms and signs involving the digestive system and abdomen: Secondary | ICD-10-CM | POA: Diagnosis not present

## 2016-07-04 ENCOUNTER — Encounter (INDEPENDENT_AMBULATORY_CARE_PROVIDER_SITE_OTHER): Payer: Self-pay | Admitting: Physical Medicine and Rehabilitation

## 2016-07-04 ENCOUNTER — Encounter (INDEPENDENT_AMBULATORY_CARE_PROVIDER_SITE_OTHER): Payer: Self-pay

## 2016-07-04 ENCOUNTER — Ambulatory Visit (INDEPENDENT_AMBULATORY_CARE_PROVIDER_SITE_OTHER): Payer: PPO

## 2016-07-04 ENCOUNTER — Ambulatory Visit (INDEPENDENT_AMBULATORY_CARE_PROVIDER_SITE_OTHER): Payer: PPO | Admitting: Physical Medicine and Rehabilitation

## 2016-07-04 VITALS — BP 142/92 | HR 75

## 2016-07-04 DIAGNOSIS — G8929 Other chronic pain: Secondary | ICD-10-CM

## 2016-07-04 DIAGNOSIS — M545 Low back pain: Secondary | ICD-10-CM

## 2016-07-04 DIAGNOSIS — M5416 Radiculopathy, lumbar region: Secondary | ICD-10-CM

## 2016-07-04 DIAGNOSIS — M961 Postlaminectomy syndrome, not elsewhere classified: Secondary | ICD-10-CM | POA: Diagnosis not present
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
## 2016-07-05 DIAGNOSIS — M19049 Primary osteoarthritis, unspecified hand: Secondary | ICD-10-CM | POA: Diagnosis not present

## 2016-07-05 DIAGNOSIS — F331 Major depressive disorder, recurrent, moderate: Secondary | ICD-10-CM | POA: Diagnosis not present

## 2016-07-05 DIAGNOSIS — I10 Essential (primary) hypertension: Secondary | ICD-10-CM | POA: Diagnosis not present

## 2016-07-05 DIAGNOSIS — R945 Abnormal results of liver function studies: Secondary | ICD-10-CM | POA: Diagnosis not present

## 2016-07-10 NOTE — Addendum Note (Signed)
Addended by: Daylene Posey T on: 07/10/2016 09:37 AM   Modules accepted: Orders

## 2016-07-29 ENCOUNTER — Telehealth (INDEPENDENT_AMBULATORY_CARE_PROVIDER_SITE_OTHER): Payer: Self-pay | Admitting: *Deleted

## 2016-07-29 NOTE — Telephone Encounter (Signed)
Received call from Dorian Pod at [facility] needing a new auth for MRI LSP with and without contrast vs with contrast, I sent message to [employer] asking if can change the code. Pending auth.

## 2016-07-31 ENCOUNTER — Inpatient Hospital Stay: Admission: RE | Admit: 2016-07-31 | Payer: No Typology Code available for payment source | Source: Ambulatory Visit

## 2016-08-07 ENCOUNTER — Ambulatory Visit (INDEPENDENT_AMBULATORY_CARE_PROVIDER_SITE_OTHER): Payer: PPO | Admitting: Physical Medicine and Rehabilitation

## 2016-09-05 ENCOUNTER — Ambulatory Visit
Admission: RE | Admit: 2016-09-05 | Discharge: 2016-09-05 | Disposition: A | Discharge status: Home / Self Care | Payer: PPO | Source: Ambulatory Visit | Attending: Physical Medicine and Rehabilitation | Admitting: Physical Medicine and Rehabilitation

## 2016-09-05 DIAGNOSIS — G8929 Other chronic pain: Secondary | ICD-10-CM

## 2016-09-05 DIAGNOSIS — M961 Postlaminectomy syndrome, not elsewhere classified: Secondary | ICD-10-CM

## 2016-09-05 DIAGNOSIS — M48061 Spinal stenosis, lumbar region without neurogenic claudication: Secondary | ICD-10-CM | POA: Diagnosis not present

## 2016-09-05 DIAGNOSIS — M5416 Radiculopathy, lumbar region: Secondary | ICD-10-CM

## 2016-09-05 DIAGNOSIS — M545 Low back pain: Principal | ICD-10-CM

## 2016-09-05 MED ORDER — GADOBENATE DIMEGLUMINE 529 MG/ML IV SOLN
15.0000 mL | Freq: Once | INTRAVENOUS | Status: AC | PRN
  Administered 2016-09-05: 15 mL via INTRAVENOUS

## 2016-09-06 ENCOUNTER — Telehealth (INDEPENDENT_AMBULATORY_CARE_PROVIDER_SITE_OTHER): Payer: Self-pay | Admitting: Physical Medicine and Rehabilitation

## 2016-09-06 NOTE — Telephone Encounter (Signed)
Submitted auth request on [employer] webite and marked it expedited

## 2016-09-09 NOTE — Telephone Encounter (Signed)
Received auth fo [id number] X 1. eff 09/06/16-12/05/16. Auth #[id number]. Pt is already scheduled for 09/16/16.

## 2016-09-15 ENCOUNTER — Other Ambulatory Visit: Payer: No Typology Code available for payment source

## 2016-09-16 ENCOUNTER — Ambulatory Visit (INDEPENDENT_AMBULATORY_CARE_PROVIDER_SITE_OTHER): Payer: PPO | Admitting: Physical Medicine and Rehabilitation

## 2016-09-16 ENCOUNTER — Encounter (INDEPENDENT_AMBULATORY_CARE_PROVIDER_SITE_OTHER): Payer: Self-pay | Admitting: Physical Medicine and Rehabilitation

## 2016-09-16 ENCOUNTER — Ambulatory Visit (INDEPENDENT_AMBULATORY_CARE_PROVIDER_SITE_OTHER): Payer: PPO

## 2016-09-16 VITALS — BP 144/76 | HR 74

## 2016-09-16 DIAGNOSIS — G8929 Other chronic pain: Secondary | ICD-10-CM

## 2016-09-16 DIAGNOSIS — M5116 Intervertebral disc disorders with radiculopathy, lumbar region: Secondary | ICD-10-CM

## 2016-09-16 DIAGNOSIS — M5416 Radiculopathy, lumbar region: Secondary | ICD-10-CM

## 2016-09-16 DIAGNOSIS — M542 Cervicalgia: Secondary | ICD-10-CM

## 2016-09-16 DIAGNOSIS — M609 Myositis, unspecified: Secondary | ICD-10-CM

## 2016-09-16 DIAGNOSIS — M961 Postlaminectomy syndrome, not elsewhere classified: Secondary | ICD-10-CM

## 2016-09-16 DIAGNOSIS — M25511 Pain in right shoulder: Secondary | ICD-10-CM

## 2016-09-16 MED ORDER — METHYLPREDNISOLONE ACETATE 80 MG/ML IJ SUSP
80.0000 mg | Freq: Once | INTRAMUSCULAR | Status: AC
  Administered 2016-09-18: 80 mg

## 2016-09-16 MED ORDER — LIDOCAINE HCL (PF) 1 % IJ SOLN: 2.0000 mL | Freq: Once | INTRAMUSCULAR | Status: DC

## 2016-09-16 NOTE — Progress Notes (Deleted)
Patient is here today for Left side low back pain and MRI review.  Also having  Left upper arm and shoulder pain and tightness for around 1 month. No injury. Left handed and is able to use arm fine.Tingling. Notices pain when arm is resting. Questioning if related to the lower back pain?

## 2016-09-16 NOTE — Patient Instructions (Signed)

## 2016-09-16 NOTE — Progress Notes (Signed)
Kara [name] - 66 y.o. female MRN [id number]  Date of birth: 26-Jul-1950  Office Visit Note: Visit Date: 09/16/2016 PCP: Fanny Bien, MD Referred by: Fanny Bien, MD  Subjective: Chief Complaint  Patient presents with  . Lower Back - Pain   HPI: Kara [name] is a pleasant 66 year old female who we initially saw for her chronic worsening low back pain and left predominant buttock and leg pain. She comes in today with continued complaints of this but is also having now over the last month or so left upper arm and shoulder blade pain which refers over into the lateral deltoid region with tightness. She reports no specific injury to the left shoulder. She is left-hand dominant is able to use her arm okay. She does get some tingling. She does not notice much in the hands. She notices pain mostly when the arm is resting. She gets relief of her arm raised above her head. She does not get any frank nocturnal complaints although is been bothering her at night at times. She questions today whether this is related to the back pain or not. She's not had any issues in the neck or shoulders or arms in the past but just random shoulder pain at times. This would come from overworking. She has not had any images of the neck or shoulder.  In terms of her low back pain continues to be worse with standing and ambulating. Somewhat worse with sitting. She has had prior laminectomy decompression at L4-5. This was a microdiscectomy. This was done on the right side and her pain really is been left-sided most recently. No numbness tingling or paresthesias. No focal weakness. She has failed conservative care with medication management and time. We saw her recently ordered MRI of the lumbar spinous is reviewed below.    Review of Systems  Constitutional: Negative for chills, fever, malaise/fatigue and weight loss.  HENT: Negative for hearing loss and sinus pain.   Eyes: Negative for blurred vision,  double vision and photophobia.  Respiratory: Negative for cough and shortness of breath.   Cardiovascular: Negative for chest pain, palpitations and leg swelling.  Gastrointestinal: Negative for abdominal pain, nausea and vomiting.  Genitourinary: Negative for flank pain.  Musculoskeletal: Positive for back pain, joint pain and neck pain. Negative for myalgias.  Skin: Negative for itching and rash.  Neurological: Positive for tingling. Negative for tremors, focal weakness and weakness.  Endo/Heme/Allergies: Negative.   Psychiatric/Behavioral: Negative for depression.  All other systems reviewed and are negative.  Otherwise per HPI.  Assessment & Plan: Visit Diagnoses:  1. Lumbar radiculopathy   2. Radiculopathy due to lumbar intervertebral disc disorder   3. Post laminectomy syndrome   4. Myofascitis   5. Cervicalgia   6. Chronic right shoulder pain     Plan: Findings:  1. Chronic worsening low back and left hip and leg pain consistent more with a L5 radiculitis. This does not travel down to the foot. She does get a lot of axial back pain as well. I think her problem is multifactorial given the new MRI findings. I think some of her back pain is probably facet mediated back pain. I think the pain that is traveling to the buttocks though is related to the disc herniation at L5-S1 which is seen in the MRI. There is no frank nerve compression that I do think she is having symptomatic problem with this. We had planned on reviewing MRI findings and evaluation of her today and  epidural injection. We will go ahead today and complete the L5 transforaminal epidural steroid injection diagnostically and hopefully therapeutically. In the future consider facet joint block and regrouping with physical therapy.  2. In terms of her new complaint of left shoulder and arm pain I feel like this is myofascial pain to a degree. She has focal trigger points in the infraspinatus as well as supraspinatus and  rhomboid. We did complete a trigger point injection today. Depending on the relief would look at imaging of her cervical spine versus physical therapy for dry needling and manual treatment. She did not have any signs of shoulder impingement. We did discuss about exercises for her shoulder as far as stretching and strengthening.   I spent more than 35 minutes speaking face-to-face with the patient with 50% of the time in counseling.    Meds & Orders:  Meds ordered this encounter  Medications  . DISCONTD: lidocaine (PF) (XYLOCAINE) 1 % injection 2 mL  . methylPREDNISolone acetate (DEPO-MEDROL) injection 80 mg    Orders Placed This Encounter  Procedures  . Trigger Point Injection  . XR C-ARM NO REPORT  . Epidural Steroid injection    Follow-up: Return if symptoms worsen or fail to improve.   Procedures: Trigger Point Inj Date/Time: 09/16/2016 2:13 PM Performed by: Magnus Sinning Authorized by: Magnus Sinning   Consent Given by:  Patient Site marked: the procedure site was marked   Timeout: prior to procedure the correct patient, procedure, and site was verified   Total # of Trigger Points:  2 Location: neck and back   Needle Size:  25 G Approach:  Dorsal Patient tolerance:  Patient tolerated the procedure well with no immediate complications Comments: Triggerpoints were palpated in the left rhomboid and infraspinatus and supraspinatus area. This did reproduce a lot of her symptoms. Needling technique was utilized.     Lumbosacral Transforaminal Epidural Steroid Injection - Infraneural Approach with Fluoroscopic Guidance  Patient: Kara [name]      Date of Birth: [birth year] MRN: [id number] PCP: Fanny Bien, MD      Visit Date: 09/16/2016   Universal Protocol:    Date/Time: 05/09/185:24 AM  Consent Given By: the patient  Position: PRONE   Additional Comments: Vital signs were monitored before and after the procedure. Patient was prepped and draped in  the usual sterile fashion. The correct patient, procedure, and site was verified.   Injection Procedure Details:  Procedure Site One Meds Administered:  Meds ordered this encounter  Medications  . lidocaine (PF) (XYLOCAINE) 1 % injection 2 mL  . methylPREDNISolone acetate (DEPO-MEDROL) injection 80 mg      Laterality: Left  Location/Site:  L5-S1  Needle size: 22 G  Needle type: Spinal  Needle Placement: Transforaminal  Findings:  -Contrast Used: 1 mL iohexol 180 mg iodine/mL   -Comments: Excellent flow of contrast along the nerve and into the epidural space.  Procedure Details: After squaring off the end-plates of the desired vertebral level to get a true AP view, the C-arm was obliqued to the painful side so that the superior articulating process is positioned about 1/3 the length of the inferior endplate.  The needle was aimed toward the junction of the superior articular process and the transverse process of the inferior vertebrae. The needle's initial entry is in the lower third of the foramen through Kambin's triangle. The soft tissues overlying this target were infiltrated with 2-3 ml. of 1% Lidocaine without Epinephrine.  The spinal needle was then inserted  and advanced toward the target using a "trajectory" view along the fluoroscope beam.  Under AP and lateral visualization, the needle was advanced so it did not puncture dura and did not traverse medially beyond the 6 o'clock position of the pedicle. Bi-planar projections were used to confirm position. Aspiration was confirmed to be negative for CSF and/or blood. A 1-2 ml. volume of Isovue-250 was injected and flow of contrast was noted at each level. Radiographs were obtained for documentation purposes.   After attaining the desired flow of contrast documented above, a 0.5 to 1.0 ml test dose of 0.25% Marcaine was injected into each respective transforaminal space.  The patient was observed for 90 seconds post injection.   After no sensory deficits were reported, and normal lower extremity motor function was noted,   the above injectate was administered so that equal amounts of the injectate were placed at each foramen (level) into the transforaminal epidural space.   Additional Comments:  The patient tolerated the procedure well Dressing: Band-Aid    Post-procedure details: Patient was observed during the procedure. Post-procedure instructions were reviewed.  Patient left the clinic in stable condition.   Clinical History: Lumbar spine MRI W/WO Contrast 09/06/2016 FINDINGS: Segmentation:  Normal, as demonstrated on the recent radiographs.  Alignment: Moderate levoconvex lumbar scoliosis, apex at L3-L4, and straightening of lumbar lordosis have mildly progressed since 2008.  Vertebrae: Rightward degenerative endplate and vertebral body marrow edema at L4 (series 7, image 7). Otherwise bone marrow signal is heterogeneous but within normal limits. Visible sacrum is intact. Negative left SI joint.  Paraspinal and other soft tissues: Negative.  Disc levels:  T11-T12: Mild facet hypertrophy.  T12-L1: Subtle central disc protrusion (series 13, image 1). Mild left facet hypertrophy. No stenosis.  L1-L2: Small broad-based right paracentral disc protrusion (series 13, image 4). Mild to moderate facet and ligament flavum hypertrophy. No stenosis.  L2-L3: Disc space loss since 2008. Increased circumferential disc bulge and endplate spurring with broad-based posterior component. Mild facet hypertrophy is not significantly changed. Increased mass effect on the ventral thecal sac. Borderline to mild spinal stenosis is new since 2008 but there is no convincing lateral recess or foraminal stenosis.  L3-L4: Disc space loss with bulky circumferential but right eccentric disc bulge and endplate spurring since 2008. Increased moderate facet hypertrophy. New moderate to severe right lateral recess  stenosis (descending right L4 nerve level series 13, image 16). New mild to moderate spinal stenosis. New mild to moderate right L3 foraminal stenosis. No significant left foraminal or low lateral recess stenosis.  L4-L5: Previous postoperative changes to the right lamina. Mild interval disc space loss. Increased right far lateral disc bulge and endplate spurring. Relatively stable moderate residual facet hypertrophy. Stable thecal sac patency, no spinal or lateral recess stenosis. However, bilateral neural foraminal stenosis has developed and is mild to moderate (left side series 14, image 12 and right side series 14, image 4).  L5-S1: Better preserved disc space height at this level since 2008, but progressed left far lateral disc bulging and endplate spurring. Furthermore, there is loss of the normal fat signal from the left L5 neural foramen best seen on series 10, images 26 and 27, obscuring the exiting left L5 nerve. Interval increased moderate bilateral facet hypertrophy. Possible previous postoperative changes to the right lamina at this level. Thecal sac remains patent without spinal stenosis. There is new mild left lateral recess stenosis (descending left S1 nerve root level series 13, image 28).  IMPRESSION: 1. Symptomatic  level with regard to left side pain favored to be L5-S1 where a left foraminal disc herniation is suspected, and superimposed on progressed left far lateral disc and endplate degeneration since the 2008 MRI. Query left L5 radiculitis. There is also new mild multifactorial left lateral recess stenosis at that level. 2. Progressed levoconvex lumbar scoliosis since 2008 along with increased disc and endplate degeneration also at L2-L3 through L4-L5. But increased neural impingement at those levels is primarily right-sided. There is new mild to moderate spinal stenosis at L2-L3 and L3-L4. 3. Prior postoperative changes at L4-L5. No recurrent  spinal stenosis but new mild to moderate bilateral L4 foraminal stenosis.  She reports that she has never smoked. She has never used smokeless tobacco. No results for input(s): HGBA1C, LABURIC in the last 8760 hours.  Objective:  VS:  HT:    WT:   BMI:     BP:(!) 144/76  HR:74bpm  TEMP: ( )  RESP:  Physical Exam  Constitutional: She is oriented to person, place, and time. She appears well-developed and well-nourished. No distress.  HENT:  Head: Normocephalic and atraumatic.  Nose: Nose normal.  Mouth/Throat: Oropharynx is clear and moist.  Eyes: Conjunctivae are normal. Pupils are equal, round, and reactive to light.  Neck: Normal range of motion. Neck supple.  Cardiovascular: Regular rhythm and intact distal pulses.   Pulmonary/Chest: Effort normal. She has no wheezes.  Abdominal: She exhibits no distension.  Musculoskeletal:  Cervical range of motion was limited in ranges particularly with left rotation and extension. There is a negative Spurling's test bilaterally. Negative Hoffmann's test bilaterally. She did not have any impingement signs of the left shoulder although she did have some limitation with rotation with external rotation. She had active trigger points in the rhomboid and infraspinatus and supraspinatus to did reproduce a lot of her pain. She had full upper body strength bilaterally without any deficits. In terms of her lower back she ambulates without aid with a slightly forward flexed spine. She does have pain with extension rotation of the lumbar spine no pain over the greater trochanters. She has good distal strength and no clonus bilaterally. She has an equivocal positive slump test on the left.  Neurological: She is alert and oriented to person, place, and time. She exhibits normal muscle tone. Coordination normal.  Skin: Skin is warm. No rash noted. No erythema.  Psychiatric: She has a normal mood and affect. Her behavior is normal.  Nursing note and vitals  reviewed.   Ortho Exam Imaging: No results found.  Past Medical/Family/Surgical/Social History: Medications & Allergies reviewed per EMR Patient Active Problem List   Diagnosis Date Noted  . Anxiety state, unspecified 09/04/2011   Past Medical History:  Diagnosis Date  . Depressive disorder, not elsewhere classified    situational (divorce, work stress)  . Herpes simplex labialis   . Osteopenia   . Seasonal allergies    Family History  Problem Relation Age of Onset  . Heart disease Mother     diagnosed in her 10's.  s/p CABG  . Cancer Mother 48    breast cancer  . Alcohol abuse Father   . Diabetes Father     refused to be treated  . COPD Sister   . Diabetes Sister   . Diabetes Brother   . Diabetes Brother   . Alcohol abuse Brother     stopped when diagnosed with DM  . Cerebral aneurysm Maternal Aunt   . Heart disease Maternal Uncle   .  Cancer Maternal Grandmother     lymphoma  . Heart disease Maternal Grandfather    Past Surgical History:  Procedure Laterality Date  . BILATERAL SALPINGOOPHORECTOMY  2004   done with hysterectomy  . BUNIONECTOMY  2007   and hammertoe surgery L  . BUNIONECTOMY     R in [place], [place]  . CESAREAN SECTION  x2   [place]  . LAPAROSCOPIC ASSISTED VAGINAL HYSTERECTOMY  2004  . LUMBAR DISC SURGERY     Social History   Occupational History  . contracting (for physicians) [place] History Main Topics  . Smoking status: Never Smoker  . Smokeless tobacco: Never Used  . Alcohol use Yes     Comment: 2 glasses of wine per evening.  . Drug use: No  . Sexual activity: Not on file

## 2016-09-18 NOTE — Procedures (Signed)
Lumbosacral Transforaminal Epidural Steroid Injection - Infraneural Approach with Fluoroscopic Guidance  Patient: Kara [name]      Date of Birth: [birth year] MRN: [id number] PCP: Fanny Bien, MD      Visit Date: 09/16/2016   Universal Protocol:    Date/Time: 05/09/185:24 AM  Consent Given By: the patient  Position: PRONE   Additional Comments: Vital signs were monitored before and after the procedure. Patient was prepped and draped in the usual sterile fashion. The correct patient, procedure, and site was verified.   Injection Procedure Details:  Procedure Site One Meds Administered:  Meds ordered this encounter  Medications  . lidocaine (PF) (XYLOCAINE) 1 % injection 2 mL  . methylPREDNISolone acetate (DEPO-MEDROL) injection 80 mg      Laterality: Left  Location/Site:  L5-S1  Needle size: 22 G  Needle type: Spinal  Needle Placement: Transforaminal  Findings:  -Contrast Used: 1 mL iohexol 180 mg iodine/mL   -Comments: Excellent flow of contrast along the nerve and into the epidural space.  Procedure Details: After squaring off the end-plates of the desired vertebral level to get a true AP view, the C-arm was obliqued to the painful side so that the superior articulating process is positioned about 1/3 the length of the inferior endplate.  The needle was aimed toward the junction of the superior articular process and the transverse process of the inferior vertebrae. The needle's initial entry is in the lower third of the foramen through Kambin's triangle. The soft tissues overlying this target were infiltrated with 2-3 ml. of 1% Lidocaine without Epinephrine.  The spinal needle was then inserted and advanced toward the target using a "trajectory" view along the fluoroscope beam.  Under AP and lateral visualization, the needle was advanced so it did not puncture dura and did not traverse medially beyond the 6 o'clock position of the pedicle. Bi-planar  projections were used to confirm position. Aspiration was confirmed to be negative for CSF and/or blood. A 1-2 ml. volume of Isovue-250 was injected and flow of contrast was noted at each level. Radiographs were obtained for documentation purposes.   After attaining the desired flow of contrast documented above, a 0.5 to 1.0 ml test dose of 0.25% Marcaine was injected into each respective transforaminal space.  The patient was observed for 90 seconds post injection.  After no sensory deficits were reported, and normal lower extremity motor function was noted,   the above injectate was administered so that equal amounts of the injectate were placed at each foramen (level) into the transforaminal epidural space.   Additional Comments:  The patient tolerated the procedure well Dressing: Band-Aid    Post-procedure details: Patient was observed during the procedure. Post-procedure instructions were reviewed.  Patient left the clinic in stable condition.

## 2016-10-02 ENCOUNTER — Telehealth (INDEPENDENT_AMBULATORY_CARE_PROVIDER_SITE_OTHER): Payer: Self-pay | Admitting: Physical Medicine and Rehabilitation

## 2016-10-02 NOTE — Telephone Encounter (Signed)
Called and left message for patient to call back to schedule.  °

## 2016-10-02 NOTE — Telephone Encounter (Signed)
Ok for OV trigger point

## 2016-10-02 NOTE — Telephone Encounter (Signed)
Scheduled for 10/15/16 at 1015.

## 2016-10-03 DIAGNOSIS — I10 Essential (primary) hypertension: Secondary | ICD-10-CM | POA: Diagnosis not present

## 2016-10-03 DIAGNOSIS — Z79899 Other long term (current) drug therapy: Secondary | ICD-10-CM | POA: Diagnosis not present

## 2016-10-03 DIAGNOSIS — Z136 Encounter for screening for cardiovascular disorders: Secondary | ICD-10-CM | POA: Diagnosis not present

## 2016-10-08 DIAGNOSIS — I10 Essential (primary) hypertension: Secondary | ICD-10-CM | POA: Diagnosis not present

## 2016-10-08 DIAGNOSIS — Z6829 Body mass index (BMI) 29.0-29.9, adult: Secondary | ICD-10-CM | POA: Diagnosis not present

## 2016-10-14 ENCOUNTER — Other Ambulatory Visit: Payer: Self-pay | Admitting: Family Medicine

## 2016-10-14 DIAGNOSIS — E559 Vitamin D deficiency, unspecified: Secondary | ICD-10-CM | POA: Diagnosis not present

## 2016-10-14 DIAGNOSIS — I10 Essential (primary) hypertension: Secondary | ICD-10-CM | POA: Diagnosis not present

## 2016-10-14 DIAGNOSIS — Z1231 Encounter for screening mammogram for malignant neoplasm of breast: Secondary | ICD-10-CM

## 2016-10-14 DIAGNOSIS — Z1211 Encounter for screening for malignant neoplasm of colon: Secondary | ICD-10-CM | POA: Diagnosis not present

## 2016-10-14 DIAGNOSIS — Z6829 Body mass index (BMI) 29.0-29.9, adult: Secondary | ICD-10-CM | POA: Diagnosis not present

## 2016-10-14 DIAGNOSIS — Z23 Encounter for immunization: Secondary | ICD-10-CM | POA: Diagnosis not present

## 2016-10-14 DIAGNOSIS — Z0000 Encounter for general adult medical examination without abnormal findings: Secondary | ICD-10-CM | POA: Diagnosis not present

## 2016-10-14 DIAGNOSIS — F411 Generalized anxiety disorder: Secondary | ICD-10-CM | POA: Diagnosis not present

## 2016-10-15 ENCOUNTER — Encounter (INDEPENDENT_AMBULATORY_CARE_PROVIDER_SITE_OTHER): Payer: Self-pay | Admitting: Physical Medicine and Rehabilitation

## 2016-10-15 ENCOUNTER — Ambulatory Visit (INDEPENDENT_AMBULATORY_CARE_PROVIDER_SITE_OTHER): Payer: PPO | Admitting: Physical Medicine and Rehabilitation

## 2016-10-15 VITALS — BP 136/79 | HR 74

## 2016-10-15 DIAGNOSIS — R202 Paresthesia of skin: Secondary | ICD-10-CM | POA: Diagnosis not present

## 2016-10-15 DIAGNOSIS — M542 Cervicalgia: Secondary | ICD-10-CM

## 2016-10-15 DIAGNOSIS — M25512 Pain in left shoulder: Secondary | ICD-10-CM | POA: Diagnosis not present

## 2016-10-15 DIAGNOSIS — M5116 Intervertebral disc disorders with radiculopathy, lumbar region: Secondary | ICD-10-CM | POA: Diagnosis not present

## 2016-10-15 DIAGNOSIS — M609 Myositis, unspecified: Secondary | ICD-10-CM | POA: Diagnosis not present

## 2016-10-15 DIAGNOSIS — G8929 Other chronic pain: Secondary | ICD-10-CM | POA: Diagnosis not present

## 2016-10-15 DIAGNOSIS — M961 Postlaminectomy syndrome, not elsewhere classified: Secondary | ICD-10-CM | POA: Diagnosis not present

## 2016-10-15 DIAGNOSIS — M5412 Radiculopathy, cervical region: Secondary | ICD-10-CM

## 2016-10-15 NOTE — Progress Notes (Deleted)
Lower back pain is better since injection, left leg pain is much better. Left arm pain and numbness. Feels like blood pressure cuff is around upper left arm with tingling down to fingers. Has to prop arm on a pillow at night. Can point to several areas of pain in neck/ left shoulder.

## 2016-10-17 ENCOUNTER — Encounter (INDEPENDENT_AMBULATORY_CARE_PROVIDER_SITE_OTHER): Payer: Self-pay | Admitting: Physical Medicine and Rehabilitation

## 2016-10-17 DIAGNOSIS — M5116 Intervertebral disc disorders with radiculopathy, lumbar region: Secondary | ICD-10-CM | POA: Diagnosis not present

## 2016-10-17 DIAGNOSIS — R202 Paresthesia of skin: Secondary | ICD-10-CM | POA: Diagnosis not present

## 2016-10-17 DIAGNOSIS — M542 Cervicalgia: Secondary | ICD-10-CM

## 2016-10-17 DIAGNOSIS — M609 Myositis, unspecified: Secondary | ICD-10-CM | POA: Diagnosis not present

## 2016-10-17 DIAGNOSIS — M5412 Radiculopathy, cervical region: Secondary | ICD-10-CM | POA: Diagnosis not present

## 2016-10-17 DIAGNOSIS — G8929 Other chronic pain: Secondary | ICD-10-CM

## 2016-10-17 DIAGNOSIS — M961 Postlaminectomy syndrome, not elsewhere classified: Secondary | ICD-10-CM | POA: Diagnosis not present

## 2016-10-17 DIAGNOSIS — M25512 Pain in left shoulder: Secondary | ICD-10-CM

## 2016-10-17 MED ORDER — LIDOCAINE HCL 1 % IJ SOLN
3.0000 mL | INTRAMUSCULAR | Status: AC | PRN
  Administered 2016-10-17: 3 mL

## 2016-10-17 MED ORDER — TRIAMCINOLONE ACETONIDE 40 MG/ML IJ SUSP
40.0000 mg | INTRAMUSCULAR | Status: AC | PRN
  Administered 2016-10-17: 40 mg via INTRA_ARTICULAR

## 2016-10-17 MED ORDER — BUPIVACAINE HCL 0.25 % IJ SOLN
4.0000 mL | INTRAMUSCULAR | Status: AC | PRN
  Administered 2016-10-17: 4 mL via INTRA_ARTICULAR

## 2016-10-17 NOTE — Progress Notes (Signed)
Kara [name] - 66 y.o. female MRN [id number]  Date of birth: 09-Apr-1951  Office Visit Note: Visit Date: 10/15/2016 PCP: Kara Bien, MD Referred by: Kara Bien, MD  Subjective: Chief Complaint  Patient presents with  . Neck - Pain   HPI: Kara [name] is a very pleasant left-hand dominant 66 year old female with chronic neck and left shoulder and arm pain which is worsening and also history of chronic low back pain with radicular complaints. Her radicular complaints are much improved after epidural injection from a transforaminal approach. We'll go ahead and continue to watch this but she is doing much better in that regard. However since we have seen her we did complete a trigger point injection in the left scapular region which is more of a supraspinatus versus trapezius area and she did get some relief. However she comes in today with continued pain that she says refers from the shoulder into the biceps region and feels like a vise is tightening around her bicep and region. She is also getting tingling numbness type sensation in the hands. She can't really give me a dermatomal pattern but it is more of the foam area but also may be more of the fourth and fifth digit. She has been told the past that she had some carpal tunnel syndrome. She is right-hand dominant. She does get pain with motion of the arm and movement. Sometimes pain can just be present. She has not had advanced imaging of the cervical spine. This is been going on for several months. She does get some relief by propping her arm up on a pillow at night behind her head. She has certain specific areas of tenderness and pain around the neck and left shoulder. She's had chronic right shoulder pain as well but is doing okay with no recent flareups. There is some tingling in the right hand. She's had no prior electrodiagnostic studies or cervical surgery.    Review of Systems  Constitutional: Negative for chills,  fever, malaise/fatigue and weight loss.  HENT: Negative for hearing loss and sinus pain.   Eyes: Negative for blurred vision, double vision and photophobia.  Respiratory: Negative for cough and shortness of breath.   Cardiovascular: Negative for chest pain, palpitations and leg swelling.  Gastrointestinal: Negative for abdominal pain, nausea and vomiting.  Genitourinary: Negative for flank pain.  Musculoskeletal: Positive for back pain, joint pain and neck pain. Negative for myalgias.  Skin: Negative for itching and rash.  Neurological: Positive for tingling. Negative for tremors, focal weakness and weakness.  Endo/Heme/Allergies: Negative.   Psychiatric/Behavioral: Negative for depression.  All other systems reviewed and are negative.  Otherwise per HPI.  Assessment & Plan: Visit Diagnoses:  1. Cervicalgia   2. Myofascitis   3. Chronic left shoulder pain   4. Cervical radiculopathy   5. Paresthesia of skin   6. Radiculopathy due to lumbar intervertebral disc disorder   7. Post laminectomy syndrome     Plan: Findings:  Complicated chronic pain patient with prior lumbar surgery with new MRI showing foraminal disc herniation at L5-S1 on the left. She is now showing significant improvement after left L5 transforaminal epidural steroid injection. She does have some increased lumbar scoliosis over time but is still not severe. She does have some degenerative changes. At this point we'll watch and see how the injection does and see what length of time she gets from the injection itself. We can always repeat this depending on how she does do right  now she has almost R percent pain relief. She'll continue with home exercises. She has been to therapy for this in the past.  For her neck shoulder and arm pain this is been some but has been ongoing now for a few months. It was not the main issue when I first saw her but we did have a long extended period try to get an MRI performed and now she's had  worsening left neck and shoulder pain. Her symptoms seem to be multifactorial. She clearly has some myofascial pain with trigger points particularly in the supraspinatus levator scapula and rhomboids and trapezius. The area over the supraspinatus this seems to be giving her the most concordant symptoms. She'll get referral patterns on exam up into the neck and ear and even down into the lumbar spine. He also has painful range of motion of the left shoulder with some impingement signs. She has a negative drop arm test. She has good strength. I think she is having some subacromial bursitis. Lastly she is getting tingling numbness sensation in the hand. He is somewhat nondermatomal. All of this could be related to the cervical spine disc problem or stenosis. Without focal weakness though I think we can treat this conservatively for now. I will ago and completed diagnostic subacromial shoulder injection as well as trigger point injection. Prior trigger point injection did give her some relief. Depending on how she does with these diagnostic injections I would like to see her in physical therapy for dry needling and manual treatment. Depending on if it declares itself and does not help very much we would look at cervical MRI of potential cervical injection. Lastly there may be some component of a carpal tunnel syndrome although it doesn't fit the nerve distribution and we would look at electrodiagnostic studies potentially. I spent more than 25 minutes speaking face-to-face with the patient with 50% of the time in counseling.    Meds & Orders: No orders of the defined types were placed in this encounter.   Orders Placed This Encounter  Procedures  . Large Joint Injection/Arthrocentesis  . Trigger Point Injection    Follow-up: Return if symptoms worsen or fail to improve.   Procedures: Subacromial shoulder injection Date/Time: 10/17/2016 12:35 PM Performed by: Magnus Sinning Authorized by: Magnus Sinning    Consent Given by:  Patient Site marked: the procedure site was marked   Timeout: prior to procedure the correct patient, procedure, and site was verified   Indications:  Pain and diagnostic evaluation Location:  Shoulder Site:  L subacromial bursa Prep: patient was prepped and draped in usual sterile fashion   Needle Size:  25 G Needle Length:  1.5 inches Approach:  Posterior Ultrasound Guidance: No   Fluoroscopic Guidance: No   Arthrogram: No   Medications:  40 mg triamcinolone acetonide 40 MG/ML; 4 mL bupivacaine 0.25 % Aspiration Attempted: No   Patient tolerance:  Patient tolerated the procedure well with no immediate complications  Injectate delivered freely without restriction through the normal position. Patient seemed to have relief during the anesthetic phase and he really showed painless range of motion.  Trigger Point Inj Date/Time: 10/17/2016 12:35 PM Performed by: Magnus Sinning Authorized by: Magnus Sinning   Total # of Trigger Points:  1 Location: back   Needle Size:  25 G Approach:  Dorsal Medications #1:  3 mL lidocaine 1 % Comments: Trigger point was palpated in what appears to be more of the supraspinatus but could also be one of the  areas around the trapezius. Needling technique was utilized.    No notes on file   Clinical History: Lumbar spine MRI W/WO Contrast 09/06/2016 FINDINGS: Segmentation:  Normal, as demonstrated on the recent radiographs.  Alignment: Moderate levoconvex lumbar scoliosis, apex at L3-L4, and straightening of lumbar lordosis have mildly progressed since 2008.  Vertebrae: Rightward degenerative endplate and vertebral body marrow edema at L4 (series 7, image 7). Otherwise bone marrow signal is heterogeneous but within normal limits. Visible sacrum is intact. Negative left SI joint.  Paraspinal and other soft tissues: Negative.  Disc levels:  T11-T12: Mild facet hypertrophy.  T12-L1: Subtle central disc protrusion  (series 13, image 1). Mild left facet hypertrophy. No stenosis.  L1-L2: Small broad-based right paracentral disc protrusion (series 13, image 4). Mild to moderate facet and ligament flavum hypertrophy. No stenosis.  L2-L3: Disc space loss since 2008. Increased circumferential disc bulge and endplate spurring with broad-based posterior component. Mild facet hypertrophy is not significantly changed. Increased mass effect on the ventral thecal sac. Borderline to mild spinal stenosis is new since 2008 but there is no convincing lateral recess or foraminal stenosis.  L3-L4: Disc space loss with bulky circumferential but right eccentric disc bulge and endplate spurring since 2008. Increased moderate facet hypertrophy. New moderate to severe right lateral recess stenosis (descending right L4 nerve level series 13, image 16). New mild to moderate spinal stenosis. New mild to moderate right L3 foraminal stenosis. No significant left foraminal or low lateral recess stenosis.  L4-L5: Previous postoperative changes to the right lamina. Mild interval disc space loss. Increased right far lateral disc bulge and endplate spurring. Relatively stable moderate residual facet hypertrophy. Stable thecal sac patency, no spinal or lateral recess stenosis. However, bilateral neural foraminal stenosis has developed and is mild to moderate (left side series 14, image 12 and right side series 14, image 4).  L5-S1: Better preserved disc space height at this level since 2008, but progressed left far lateral disc bulging and endplate spurring. Furthermore, there is loss of the normal fat signal from the left L5 neural foramen best seen on series 10, images 26 and 27, obscuring the exiting left L5 nerve. Interval increased moderate bilateral facet hypertrophy. Possible previous postoperative changes to the right lamina at this level. Thecal sac remains patent without spinal stenosis. There is new mild  left lateral recess stenosis (descending left S1 nerve root level series 13, image 28).  IMPRESSION: 1. Symptomatic level with regard to left side pain favored to be L5-S1 where a left foraminal disc herniation is suspected, and superimposed on progressed left far lateral disc and endplate degeneration since the 2008 MRI. Query left L5 radiculitis. There is also new mild multifactorial left lateral recess stenosis at that level. 2. Progressed levoconvex lumbar scoliosis since 2008 along with increased disc and endplate degeneration also at L2-L3 through L4-L5. But increased neural impingement at those levels is primarily right-sided. There is new mild to moderate spinal stenosis at L2-L3 and L3-L4. 3. Prior postoperative changes at L4-L5. No recurrent spinal stenosis but new mild to moderate bilateral L4 foraminal stenosis.  She reports that she has never smoked. She has never used smokeless tobacco. No results for input(s): HGBA1C, LABURIC in the last 8760 hours.  Objective:  VS:  HT:    WT:   BMI:     BP:136/79  HR:74bpm  TEMP: ( )  RESP:  Physical Exam  Constitutional: She is oriented to person, place, and time. She appears well-developed and well-nourished.  Eyes: Conjunctivae and EOM are normal. Pupils are equal, round, and reactive to light.  Neck: Neck supple. No tracheal deviation present.  Cardiovascular: Normal rate and intact distal pulses.   Pulmonary/Chest: Effort normal.  Musculoskeletal:  Cervical range of motion is limited with forward flexion and extension and left rotation more than right rotation. She has positive active trigger points in the left parascapular region including the supraspinatus and levator scapula and rhomboid. These reproduce pain in the shoulder as well as in the neck and year and even somewhat down the lumbar spine from the scapular region. She has pain with external rotation and impingement signs on the left shoulder is more than the right.  She has no pain over the biceps tendon. No pain over the a.c. Joint.  She has good strength in the upper extremities bilaterally without any deficits symmetrically. She is left-hand dominant. She does have a positive Tinel's test on the left wrist with a negative Phalen's test.  Lymphadenopathy:    She has no cervical adenopathy.  Neurological: She is alert and oriented to person, place, and time. She exhibits normal muscle tone. Coordination normal.  Skin: Skin is warm and dry. No rash noted. No erythema.  Psychiatric: She has a normal mood and affect. Her behavior is normal.  Nursing note and vitals reviewed.   Ortho Exam Imaging: No results found.  Past Medical/Family/Surgical/Social History: Medications & Allergies reviewed per EMR Patient Active Problem List   Diagnosis Date Noted  . Anxiety state, unspecified 09/04/2011   Past Medical History:  Diagnosis Date  . Depressive disorder, not elsewhere classified    situational (divorce, work stress)  . Herpes simplex labialis   . Osteopenia   . Seasonal allergies    Family History  Problem Relation Age of Onset  . Heart disease Mother        diagnosed in her 59's.  s/p CABG  . Cancer Mother 45       breast cancer  . Alcohol abuse Father   . Diabetes Father        refused to be treated  . COPD Sister   . Diabetes Sister   . Diabetes Brother   . Diabetes Brother   . Alcohol abuse Brother        stopped when diagnosed with DM  . Cerebral aneurysm Maternal Aunt   . Heart disease Maternal Uncle   . Cancer Maternal Grandmother        lymphoma  . Heart disease Maternal Grandfather    Past Surgical History:  Procedure Laterality Date  . BILATERAL SALPINGOOPHORECTOMY  2004   done with hysterectomy  . BUNIONECTOMY  2007   and hammertoe surgery L  . BUNIONECTOMY     R in [place], [place]  . CESAREAN SECTION  x2   [place]  . LAPAROSCOPIC ASSISTED VAGINAL HYSTERECTOMY  2004  . LUMBAR DISC SURGERY     Social History    Occupational History  . contracting (for physicians) [place] History Main Topics  . Smoking status: Never Smoker  . Smokeless tobacco: Never Used  . Alcohol use Yes     Comment: 2 glasses of wine per evening.  . Drug use: No  . Sexual activity: Not on file

## 2016-10-25 ENCOUNTER — Ambulatory Visit: Payer: PPO

## 2016-11-04 ENCOUNTER — Ambulatory Visit
Admission: RE | Admit: 2016-11-04 | Discharge: 2016-11-04 | Disposition: A | Discharge status: Home / Self Care | Payer: PPO | Source: Ambulatory Visit | Attending: Family Medicine | Admitting: Family Medicine

## 2016-11-04 DIAGNOSIS — Z1231 Encounter for screening mammogram for malignant neoplasm of breast: Secondary | ICD-10-CM

## 2016-11-19 ENCOUNTER — Encounter: Payer: Self-pay | Admitting: Family Medicine

## 2016-12-24 DIAGNOSIS — R42 Dizziness and giddiness: Secondary | ICD-10-CM | POA: Diagnosis not present

## 2016-12-24 DIAGNOSIS — H6592 Unspecified nonsuppurative otitis media, left ear: Secondary | ICD-10-CM | POA: Diagnosis not present

## 2017-01-08 DIAGNOSIS — H6592 Unspecified nonsuppurative otitis media, left ear: Secondary | ICD-10-CM | POA: Diagnosis not present

## 2017-01-08 DIAGNOSIS — R42 Dizziness and giddiness: Secondary | ICD-10-CM | POA: Diagnosis not present

## 2017-01-08 DIAGNOSIS — J309 Allergic rhinitis, unspecified: Secondary | ICD-10-CM | POA: Diagnosis not present

## 2017-01-28 DIAGNOSIS — L570 Actinic keratosis: Secondary | ICD-10-CM | POA: Diagnosis not present

## 2017-01-28 DIAGNOSIS — L820 Inflamed seborrheic keratosis: Secondary | ICD-10-CM | POA: Diagnosis not present

## 2017-01-28 DIAGNOSIS — H6592 Unspecified nonsuppurative otitis media, left ear: Secondary | ICD-10-CM | POA: Diagnosis not present

## 2017-01-28 DIAGNOSIS — J309 Allergic rhinitis, unspecified: Secondary | ICD-10-CM | POA: Diagnosis not present

## 2017-01-28 DIAGNOSIS — Z23 Encounter for immunization: Secondary | ICD-10-CM | POA: Diagnosis not present

## 2017-02-21 DIAGNOSIS — L570 Actinic keratosis: Secondary | ICD-10-CM | POA: Diagnosis not present

## 2017-04-03 DIAGNOSIS — S91332A Puncture wound without foreign body, left foot, initial encounter: Secondary | ICD-10-CM | POA: Diagnosis not present

## 2017-04-03 DIAGNOSIS — W458XXA Other foreign body or object entering through skin, initial encounter: Secondary | ICD-10-CM | POA: Diagnosis not present

## 2017-04-03 DIAGNOSIS — L03116 Cellulitis of left lower limb: Secondary | ICD-10-CM | POA: Diagnosis not present

## 2017-05-02 DIAGNOSIS — W450XXD Nail entering through skin, subsequent encounter: Secondary | ICD-10-CM | POA: Diagnosis not present

## 2017-05-02 DIAGNOSIS — Z6829 Body mass index (BMI) 29.0-29.9, adult: Secondary | ICD-10-CM | POA: Diagnosis not present

## 2017-05-02 DIAGNOSIS — S91339D Puncture wound without foreign body, unspecified foot, subsequent encounter: Secondary | ICD-10-CM | POA: Diagnosis not present

## 2017-05-02 DIAGNOSIS — N390 Urinary tract infection, site not specified: Secondary | ICD-10-CM | POA: Diagnosis not present

## 2017-05-09 DIAGNOSIS — E782 Mixed hyperlipidemia: Secondary | ICD-10-CM | POA: Diagnosis not present

## 2017-05-12 DIAGNOSIS — Z6828 Body mass index (BMI) 28.0-28.9, adult: Secondary | ICD-10-CM | POA: Diagnosis not present

## 2017-05-12 DIAGNOSIS — F331 Major depressive disorder, recurrent, moderate: Secondary | ICD-10-CM | POA: Diagnosis not present

## 2017-05-12 DIAGNOSIS — E782 Mixed hyperlipidemia: Secondary | ICD-10-CM | POA: Diagnosis not present

## 2017-05-12 DIAGNOSIS — I10 Essential (primary) hypertension: Secondary | ICD-10-CM | POA: Diagnosis not present

## 2017-08-26 ENCOUNTER — Ambulatory Visit (INDEPENDENT_AMBULATORY_CARE_PROVIDER_SITE_OTHER): Payer: PPO | Admitting: Physical Medicine and Rehabilitation

## 2017-08-26 ENCOUNTER — Ambulatory Visit (INDEPENDENT_AMBULATORY_CARE_PROVIDER_SITE_OTHER): Payer: PPO

## 2017-08-26 ENCOUNTER — Encounter (INDEPENDENT_AMBULATORY_CARE_PROVIDER_SITE_OTHER): Payer: Self-pay | Admitting: Physical Medicine and Rehabilitation

## 2017-08-26 VITALS — BP 150/87 | HR 64 | Temp 98.2°F

## 2017-08-26 DIAGNOSIS — M25552 Pain in left hip: Secondary | ICD-10-CM

## 2017-08-26 DIAGNOSIS — G8929 Other chronic pain: Secondary | ICD-10-CM

## 2017-08-26 DIAGNOSIS — M25551 Pain in right hip: Secondary | ICD-10-CM | POA: Diagnosis not present

## 2017-08-26 DIAGNOSIS — M25511 Pain in right shoulder: Secondary | ICD-10-CM

## 2017-08-26 DIAGNOSIS — M25512 Pain in left shoulder: Secondary | ICD-10-CM

## 2017-08-26 DIAGNOSIS — M5442 Lumbago with sciatica, left side: Secondary | ICD-10-CM

## 2017-08-26 DIAGNOSIS — M419 Scoliosis, unspecified: Secondary | ICD-10-CM

## 2017-08-26 DIAGNOSIS — M7918 Myalgia, other site: Secondary | ICD-10-CM

## 2017-08-26 DIAGNOSIS — M25562 Pain in left knee: Secondary | ICD-10-CM

## 2017-08-26 NOTE — Progress Notes (Signed)
 .  Numeric Pain Rating Scale and Functional Assessment Average Pain 5 Pain Right Now 5 My pain is constant and aching Pain is worse with: walking, bending, standing and some activites Pain improves with: rest   In the last MONTH (on 0-10 scale) has pain interfered with the following?  1. General activity like being  able to carry out your everyday physical activities such as walking, climbing stairs, carrying groceries, or moving a chair?  Rating(5)  2. Relation with others like being able to carry out your usual social activities and roles such as  activities at home, at work and in your community. Rating(3)  3. Enjoyment of life such that you have  been bothered by emotional problems such as feeling anxious, depressed or irritable?  Rating(0)

## 2017-08-27 DIAGNOSIS — H524 Presbyopia: Secondary | ICD-10-CM | POA: Diagnosis not present

## 2017-08-27 DIAGNOSIS — H25813 Combined forms of age-related cataract, bilateral: Secondary | ICD-10-CM | POA: Diagnosis not present

## 2017-09-04 ENCOUNTER — Encounter (INDEPENDENT_AMBULATORY_CARE_PROVIDER_SITE_OTHER): Payer: Self-pay | Admitting: Physical Medicine and Rehabilitation

## 2017-09-04 DIAGNOSIS — M25512 Pain in left shoulder: Secondary | ICD-10-CM

## 2017-09-04 DIAGNOSIS — G8929 Other chronic pain: Secondary | ICD-10-CM | POA: Diagnosis not present

## 2017-09-04 DIAGNOSIS — M25552 Pain in left hip: Secondary | ICD-10-CM

## 2017-09-04 DIAGNOSIS — M25511 Pain in right shoulder: Secondary | ICD-10-CM | POA: Diagnosis not present

## 2017-09-04 DIAGNOSIS — M7918 Myalgia, other site: Secondary | ICD-10-CM | POA: Diagnosis not present

## 2017-09-04 MED ORDER — TRIAMCINOLONE ACETONIDE 40 MG/ML IJ SUSP
20.0000 mg | INTRAMUSCULAR | Status: AC | PRN
  Administered 2017-09-04: 20 mg via INTRAMUSCULAR

## 2017-09-04 MED ORDER — LIDOCAINE HCL 1 % IJ SOLN
3.0000 mL | INTRAMUSCULAR | Status: AC | PRN
  Administered 2017-09-04: 3 mL

## 2017-09-04 MED ORDER — TRIAMCINOLONE ACETONIDE 40 MG/ML IJ SUSP
80.0000 mg | INTRAMUSCULAR | Status: AC | PRN
  Administered 2017-09-04: 80 mg via INTRA_ARTICULAR

## 2017-09-04 MED ORDER — BUPIVACAINE HCL 0.5 % IJ SOLN
3.0000 mL | INTRAMUSCULAR | Status: AC | PRN
  Administered 2017-09-04: 3 mL via INTRA_ARTICULAR

## 2017-09-04 NOTE — Progress Notes (Signed)
Kara [name] - 67 y.o. female MRN [id number]  Date of birth: [birth year]  Office Visit Note: Visit Date: 08/26/2017 PCP: Kara Bien, MD Referred by: Kara Bien, MD  Subjective: Chief Complaint  Patient presents with  . Lower Back - Pain  . Right Shoulder - Pain  . Left Shoulder - Pain  . Right Hip - Pain  . Left Hip - Pain  . Left Knee - Pain   HPI: Kara [name] who goes by Kara [name] comes in today with several pain complaints.  The last time I saw her was in June 2018.  I reviewed the history I had seen her mother or mother-in-law at some point and she had asked [place] to evaluate some of the complaints she was having.  She has a significant lumbar spine issue with prior lumbar laminectomy and decompression and MRI completed in 2018 that shows multifactorial facet arthropathy which is fairly significant with some lateral recess narrowing and a left-sided lateral and foraminal protrusion at L5-S1.  We completed a transforaminal injection that did give her some relief on the left.  She also came in in June with neck pain and shoulder pain.  We completed subacromial injection as well as trigger point injection with really good relief and I have not seen her since that point.  She reports doing pretty well up until just recently.  Her biggest complaint is pain in the back and both hips particularly in the left hip and groin with referral to the knee.  She was states that that pain is been going on for a few months.  She reports that she had spasms in her left thigh since we completed the injection last year in May.  This was a transforaminal L5 injection.  She also still endorses neck pain referral to both shoulders.  She does not really mention pain in the shoulders with rotation.  She has had no new trauma.  She denies any paresthesias or radicular pain down to the feet.  She has had no focal weakness.  She has had some issues with different medications in the past and has  intolerances to codeine and Lyrica.  She still remains physically active and does not really like to take medications much.  Again her biggest complaint is the hips particularly on the left going from sit to standing she does get relief with sitting.  We did complete x-rays of the pelvis and left hip today and those are reviewed below.   Review of Systems  Constitutional: Negative for chills, fever, malaise/fatigue and weight loss.  HENT: Negative for hearing loss and sinus pain.   Eyes: Negative for blurred vision, double vision and photophobia.  Respiratory: Negative for cough and shortness of breath.   Cardiovascular: Negative for chest pain, palpitations and leg swelling.  Gastrointestinal: Negative for abdominal pain, nausea and vomiting.  Genitourinary: Negative for flank pain.  Musculoskeletal: Positive for back pain, joint pain and neck pain. Negative for myalgias.  Skin: Negative for itching and rash.  Neurological: Negative for tremors, focal weakness and weakness.  Endo/Heme/Allergies: Negative.   Psychiatric/Behavioral: Negative for depression.  All other systems reviewed and are negative.  Otherwise per HPI.  Assessment & Plan: Visit Diagnoses:  1. Pain in left hip   2. Pain in right hip   3. Chronic pain of both shoulders   4. Chronic pain of left knee   5. Chronic bilateral low back pain with left-sided sciatica   6. Scoliosis of lumbar  spine, unspecified scoliosis type   7. Myofascial pain syndrome     Plan: Findings:  1.  Chronic history of worsening bilateral hip pain referral in the left groin and knee with clinical exam consistent with hip pathology.  She does get pain on internal rotation of the left hip which is concordant with her pain.  We did look at x-rays today and there is some medial joint line narrowing but nothing really significant other than that.  I think it would be worthwhile to complete a diagnostic and hopefully therapeutic anesthetic Arthrogram with  fluoroscopic guidance.  We discussed this with the patient and she does want to proceed with that.  As of note she did get good relief during anesthetic phase.  Depending on whether this had several either have her see Dr. Ninfa Linden in the office or possibility of MRI of her pelvis.  She would do well regrouping with physical therapy but does not want to do that at this moment.  We discussed activity modification etc.  Hopefully she will do well with the injection itself.  3.  Chronic history of lumbar and low back pain.  She does have pain on extension of the lumbar spine and multilevel facet arthropathy.  I think this is a chronic situation with her and I think she will probably do well with facet joint blocks and radiofrequency ablation although right now her hip is the biggest issue.  She has had some level of radicular pain in the past but does not appear to be having much of that today and I think this is related to the hip itself as seen above.  Regardless she has had prior lumbar laminectomy and continues to have some degenerative changes there with scoliosis.  She is active and has normal BMI.  Really no changes to this today I think she would benefit from regrouping with physical therapy for a short course but again she defers this at the moment.  In the future could work again at transforaminal injection if needed versus medial branch blocks.  3.  Neck and shoulder pain chronic and worsening appears to be myofascial pain we did complete trigger point injections today at her request.  Again she might benefit from physical therapy with dry needling and this was discussed.  She did not have much in the way of shoulder pain with rotation or impingement.  She had a history of this in the past and did well with shoulder subacromial injection.  We talked about exercises and stress management.  She does have a diagnosis of anxiety and I think this underlies a lot of the pain complaints overall as a manifest  themselves with different activities.    Meds & Orders: No orders of the defined types were placed in this encounter.   Orders Placed This Encounter  Procedures  . Large Joint Inj  . Trigger Point Inj  . XR HIP UNILAT W OR W/O PELVIS 1V LEFT  . XR C-ARM NO REPORT    Follow-up: No follow-ups on file.   Procedures: Large Joint Inj: L hip joint on 09/04/2017 5:30 AM Indications: pain and diagnostic evaluation Details: 22 G needle, anterior approach  Arthrogram: Yes  Medications: 80 mg triamcinolone acetonide 40 MG/ML; 3 mL bupivacaine 0.5 % Outcome: tolerated well, no immediate complications  Arthrogram demonstrated excellent flow of contrast throughout the joint surface without extravasation or obvious defect.  The patient had relief of symptoms during the anesthetic phase of the injection.  Procedure, treatment  alternatives, risks and benefits explained, specific risks discussed. Consent was given by the patient. Immediately prior to procedure a time out was called to verify the correct patient, procedure, equipment, support staff and site/side marked as required. Patient was prepped and draped in the usual sterile fashion.   Trigger Point Inj Date/Time: 09/04/2017 5:31 AM Performed by: Magnus Sinning, MD Authorized by: Magnus Sinning, MD   Consent Given by:  Patient Site marked: the procedure site was marked   Timeout: prior to procedure the correct patient, procedure, and site was verified   Total # of Trigger Points:  3 or more Location: neck and back   Needle Size:  25 G Approach:  Dorsal Medications #1:  20 mg triamcinolone acetonide 40 MG/ML Medications #2:  3 mL lidocaine 1 % Additional Injections?: No   Patient tolerance:  Patient tolerated the procedure well with no immediate complications Comments: Trigger points were palpated in the levator scapula and trapezius and supraspinatus and needling technique was utilized during the injection.    No notes on file     Clinical History: Lumbar spine MRI W/WO Contrast 09/06/2016 FINDINGS: Segmentation:  Normal, as demonstrated on the recent radiographs.  Alignment: Moderate levoconvex lumbar scoliosis, apex at L3-L4, and straightening of lumbar lordosis have mildly progressed since 2008.  Vertebrae: Rightward degenerative endplate and vertebral body marrow edema at L4 (series 7, image 7). Otherwise bone marrow signal is heterogeneous but within normal limits. Visible sacrum is intact. Negative left SI joint.  Paraspinal and other soft tissues: Negative.  Disc levels:  T11-T12: Mild facet hypertrophy.  T12-L1: Subtle central disc protrusion (series 13, image 1). Mild left facet hypertrophy. No stenosis.  L1-L2: Small broad-based right paracentral disc protrusion (series 13, image 4). Mild to moderate facet and ligament flavum hypertrophy. No stenosis.  L2-L3: Disc space loss since 2008. Increased circumferential disc bulge and endplate spurring with broad-based posterior component. Mild facet hypertrophy is not significantly changed. Increased mass effect on the ventral thecal sac. Borderline to mild spinal stenosis is new since 2008 but there is no convincing lateral recess or foraminal stenosis.  L3-L4: Disc space loss with bulky circumferential but right eccentric disc bulge and endplate spurring since 2008. Increased moderate facet hypertrophy. New moderate to severe right lateral recess stenosis (descending right L4 nerve level series 13, image 16). New mild to moderate spinal stenosis. New mild to moderate right L3 foraminal stenosis. No significant left foraminal or low lateral recess stenosis.  L4-L5: Previous postoperative changes to the right lamina. Mild interval disc space loss. Increased right far lateral disc bulge and endplate spurring. Relatively stable moderate residual facet hypertrophy. Stable thecal sac patency, no spinal or lateral recess stenosis.  However, bilateral neural foraminal stenosis has developed and is mild to moderate (left side series 14, image 12 and right side series 14, image 4).  L5-S1: Better preserved disc space height at this level since 2008, but progressed left far lateral disc bulging and endplate spurring. Furthermore, there is loss of the normal fat signal from the left L5 neural foramen best seen on series 10, images 26 and 27, obscuring the exiting left L5 nerve. Interval increased moderate bilateral facet hypertrophy. Possible previous postoperative changes to the right lamina at this level. Thecal sac remains patent without spinal stenosis. There is new mild left lateral recess stenosis (descending left S1 nerve root level series 13, image 28).  IMPRESSION: 1. Symptomatic level with regard to left side pain favored to be L5-S1 where a left  foraminal disc herniation is suspected, and superimposed on progressed left far lateral disc and endplate degeneration since the 2008 MRI. Query left L5 radiculitis. There is also new mild multifactorial left lateral recess stenosis at that level. 2. Progressed levoconvex lumbar scoliosis since 2008 along with increased disc and endplate degeneration also at L2-L3 through L4-L5. But increased neural impingement at those levels is primarily right-sided. There is new mild to moderate spinal stenosis at L2-L3 and L3-L4. 3. Prior postoperative changes at L4-L5. No recurrent spinal stenosis but new mild to moderate bilateral L4 foraminal stenosis.   She reports that she has never smoked. She has never used smokeless tobacco. No results for input(s): HGBA1C, LABURIC in the last 8760 hours.  Objective:  VS:  HT:    WT:   BMI:     BP:(!) 150/87  HR:64bpm  TEMP:98.2 F (36.8 C)(Oral)  RESP:96 % Physical Exam  Constitutional: She is oriented to person, place, and time. She appears well-developed and well-nourished. No distress.  HENT:  Head: Normocephalic and  atraumatic.  Nose: Nose normal.  Mouth/Throat: Oropharynx is clear and moist.  Eyes: Pupils are equal, round, and reactive to light. Conjunctivae are normal.  Neck: Normal range of motion. Neck supple.  Cardiovascular: Regular rhythm and intact distal pulses.  Pulmonary/Chest: Effort normal. No respiratory distress.  Abdominal: She exhibits no distension. There is no guarding.  Musculoskeletal:  Examination of the lumbar spine and pelvis shows mild scoliosis and pain with extension rotation of the lumbar spine concordant with facet joint loading.  She does not have much in the way of tender points but does have some taut bands in the lower paraspinal musculature.  She has no real pain over the greater trochanters although she is a little bit tender.  Internal rotation of the left hip is pretty exquisite pain that seems to reproduce her pain that she is having in the anterior lateral aspect of the left hip.  Right hip seems to move freely.  She has good distal strength without clonus.  Examination of the cervical spine shows prominent trigger points in the trapezius and levator scapula in particular.  She has no real impingement signs of the shoulders although she gets some pain at end ranges.  She has good upper body strength.  Lymphadenopathy:    She has no cervical adenopathy.  Neurological: She is alert and oriented to person, place, and time. She exhibits normal muscle tone. Coordination normal.  Skin: Skin is warm. No rash noted. No erythema.  Psychiatric: She has a normal mood and affect. Her behavior is normal.  Nursing note and vitals reviewed.   Ortho Exam Imaging: No results found.  Past Medical/Family/Surgical/Social History: Medications & Allergies reviewed per EMR, new medications updated. Patient Active Problem List   Diagnosis Date Noted  . Anxiety state, unspecified 09/04/2011   Past Medical History:  Diagnosis Date  . Depressive disorder, not elsewhere classified     situational (divorce, work stress)  . Herpes simplex labialis   . Osteopenia   . Seasonal allergies    Family History  Problem Relation Age of Onset  . Heart disease Mother        diagnosed in her 33's.  s/p CABG  . Cancer Mother 62       breast cancer  . Breast cancer Mother   . Alcohol abuse Father   . Diabetes Father        refused to be treated  . COPD Sister   . Diabetes  Sister   . Diabetes Brother   . Diabetes Brother   . Alcohol abuse Brother        stopped when diagnosed with DM  . Cerebral aneurysm Maternal Aunt   . Heart disease Maternal Uncle   . Cancer Maternal Grandmother        lymphoma  . Heart disease Maternal Grandfather    Past Surgical History:  Procedure Laterality Date  . BILATERAL SALPINGOOPHORECTOMY  2004   done with hysterectomy  . BUNIONECTOMY  2007   and hammertoe surgery L  . BUNIONECTOMY     R in [place], [place]  . CESAREAN SECTION  x2   [place]  . LAPAROSCOPIC ASSISTED VAGINAL HYSTERECTOMY  2004  . LUMBAR DISC SURGERY     Social History   Occupational History  . Occupation: International aid/development worker (for physicians)    Employer: [employer]  Tobacco Use  . Smoking status: Never Smoker  . Smokeless tobacco: Never Used  Substance and Sexual Activity  . Alcohol use: Yes    Comment: 2 glasses of wine per evening.  . Drug use: No  . Sexual activity: Not on file

## 2017-09-29 ENCOUNTER — Telehealth (INDEPENDENT_AMBULATORY_CARE_PROVIDER_SITE_OTHER): Payer: Self-pay | Admitting: Physical Medicine and Rehabilitation

## 2017-09-29 NOTE — Telephone Encounter (Signed)
Best would be to see Dr. Ninfa Linden for eval as I thought in my last note.

## 2017-09-29 NOTE — Telephone Encounter (Signed)
Called patient and left message to advise. 

## 2017-10-02 ENCOUNTER — Ambulatory Visit (INDEPENDENT_AMBULATORY_CARE_PROVIDER_SITE_OTHER): Payer: PPO | Admitting: Orthopaedic Surgery

## 2017-10-02 ENCOUNTER — Encounter (INDEPENDENT_AMBULATORY_CARE_PROVIDER_SITE_OTHER): Payer: Self-pay | Admitting: Orthopaedic Surgery

## 2017-10-02 DIAGNOSIS — M5431 Sciatica, right side: Secondary | ICD-10-CM

## 2017-10-02 NOTE — Progress Notes (Signed)
Patient is a very pleasant 67 year old Dr. Ernestina Patches will need to see mainly for left hip pain.  However she comes in today with right-sided sciatica.  Dr. Ernestina Patches did provide a steroid injection in her left hip under direct fluoroscopy recently.  She states since then she is had no pain in her groin anymore.  I read his note he did note that she had relief from the anesthetic portion of the injection.  She comes in today though saying her pain is on the right side is not in the groin on either side today.  On exam I can put both hips through full range of motion with extremes of range of motion causing no pain at all.  I can compress both hips causing no pain in either hip.  I did review x-rays of both hips from earlier this year and showed no significant arthritic changes in either hip.  An MRI from last her lumbar spine did show severe scoliosis and bilateral L4 foraminal stenosis.  She did have a symptomatic herniated disks a left-sided L5-S1 knee did provide injection at L5-S1 last year.  She would like to be seen by Dr. Ernestina Patches again and I do feel that may be a right sided injection L4 is reasonable based on her clinical exam today.  I do feel confident that her issues today or not hip related based on my exam and review of her x-rays as well.

## 2017-10-03 ENCOUNTER — Other Ambulatory Visit (INDEPENDENT_AMBULATORY_CARE_PROVIDER_SITE_OTHER): Payer: Self-pay

## 2017-10-03 DIAGNOSIS — G8929 Other chronic pain: Secondary | ICD-10-CM

## 2017-10-03 DIAGNOSIS — M5442 Lumbago with sciatica, left side: Principal | ICD-10-CM

## 2017-10-27 ENCOUNTER — Encounter

## 2017-10-27 ENCOUNTER — Ambulatory Visit (INDEPENDENT_AMBULATORY_CARE_PROVIDER_SITE_OTHER): Payer: PPO

## 2017-10-27 ENCOUNTER — Encounter (INDEPENDENT_AMBULATORY_CARE_PROVIDER_SITE_OTHER): Payer: Self-pay | Admitting: Physical Medicine and Rehabilitation

## 2017-10-27 ENCOUNTER — Ambulatory Visit (INDEPENDENT_AMBULATORY_CARE_PROVIDER_SITE_OTHER): Payer: PPO | Admitting: Physical Medicine and Rehabilitation

## 2017-10-27 VITALS — BP 136/85 | HR 82

## 2017-10-27 DIAGNOSIS — M419 Scoliosis, unspecified: Secondary | ICD-10-CM | POA: Diagnosis not present

## 2017-10-27 DIAGNOSIS — M7918 Myalgia, other site: Secondary | ICD-10-CM

## 2017-10-27 DIAGNOSIS — M5416 Radiculopathy, lumbar region: Secondary | ICD-10-CM | POA: Diagnosis not present

## 2017-10-27 DIAGNOSIS — M961 Postlaminectomy syndrome, not elsewhere classified: Secondary | ICD-10-CM | POA: Diagnosis not present

## 2017-10-27 MED ORDER — BETAMETHASONE SOD PHOS & ACET 6 (3-3) MG/ML IJ SUSP
12.0000 mg | Freq: Once | INTRAMUSCULAR | Status: AC
  Administered 2017-10-27: 12 mg

## 2017-10-27 NOTE — Patient Instructions (Signed)

## 2017-10-27 NOTE — Progress Notes (Signed)
 .  Numeric Pain Rating Scale and Functional Assessment Average Pain 8   In the last MONTH (on 0-10 scale) has pain interfered with the following?  1. General activity like being  able to carry out your everyday physical activities such as walking, climbing stairs, carrying groceries, or moving a chair?  Rating(5)   +Driver, -BT, -Dye Allergies.  

## 2017-10-27 NOTE — Procedures (Signed)
Lumbosacral Transforaminal Epidural Steroid Injection - Sub-Pedicular Approach with Fluoroscopic Guidance  Patient: Kara [name]      Date of Birth: Jan 09, 1951 MRN: [id number] PCP: Fanny Bien, MD      Visit Date: 10/27/2017   Universal Protocol:    Date/Time: 10/27/2017  Consent Given By: the patient  Position: PRONE  Additional Comments: Vital signs were monitored before and after the procedure. Patient was prepped and draped in the usual sterile fashion. The correct patient, procedure, and site was verified.   Injection Procedure Details:  Procedure Site One Meds Administered:  Meds ordered this encounter  Medications  . betamethasone acetate-betamethasone sodium phosphate (CELESTONE) injection 12 mg    Laterality: Right  Location/Site:  L4-L5  Needle size: 22 G  Needle type: Spinal  Needle Placement: Transforaminal  Findings:    -Comments: Excellent flow of contrast along the nerve and into the epidural space.  Procedure Details: After squaring off the end-plates to get a true AP view, the C-arm was positioned so that an oblique view of the foramen as noted above was visualized. The target area is just inferior to the "nose of the scotty dog" or sub pedicular. The soft tissues overlying this structure were infiltrated with 2-3 ml. of 1% Lidocaine without Epinephrine.  The spinal needle was inserted toward the target using a "trajectory" view along the fluoroscope beam.  Under AP and lateral visualization, the needle was advanced so it did not puncture dura and was located close the 6 O'Clock position of the pedical in AP tracterory. Biplanar projections were used to confirm position. Aspiration was confirmed to be negative for CSF and/or blood. A 1-2 ml. volume of Isovue-250 was injected and flow of contrast was noted at each level. Radiographs were obtained for documentation purposes.   After attaining the desired flow of contrast documented above,  a 0.5 to 1.0 ml test dose of 0.25% Marcaine was injected into each respective transforaminal space.  The patient was observed for 90 seconds post injection.  After no sensory deficits were reported, and normal lower extremity motor function was noted,   the above injectate was administered so that equal amounts of the injectate were placed at each foramen (level) into the transforaminal epidural space.   Additional Comments:  The patient tolerated the procedure well Dressing: Band-Aid    Post-procedure details: Patient was observed during the procedure. Post-procedure instructions were reviewed.  Patient left the clinic in stable condition.

## 2017-10-27 NOTE — Progress Notes (Signed)
Kara [name] - 67 y.o. female MRN [id number]  Date of birth: [birth year]  Office Visit Note: Visit Date: 10/27/2017 PCP: Fanny Bien, MD Referred by: Fanny Bien, MD  Subjective: Chief Complaint  Patient presents with  . Lower Back - Pain  . Right Leg - Pain   HPI: Kara [name] is a 41-year-old female that I have known over the last couple of years he has had prior lumbar surgery and continued low back and bilateral hip and leg pain at times.  The last time I saw her she actually is having more left hip and groin pain that was worse with hip rotation and got much better after intra-articular hip injection.  We actually had her see Dr. Ninfa Linden for evaluation but when she saw him she was actually having more right-sided sciatic type pain down the leg to the knee.  She comes in today again with lower back pain mainly on the right side radiating to the right anterior lateral leg to the knee.  This is been worsening with going up and down stairs and nothing really helps the pain at all.  She is failed conservative care including medication management and therapy in the past.  We have completed an epidural injection in the past on the left side with good relief.  She has had fairly recent MRI of the last couple of years with contrast.  That does show L3-4 lateral recess narrowing which could affect the L4 nerve root and that seems to be worse going on at this point on the right side.  Her left side is doing quite well.  Her average pain is 8 out of 10 and she is had no new trauma.  We will complete a diagnostic note for therapeutic right L4 transforaminal epidural steroid injection.   ROS Otherwise per HPI.  Assessment & Plan: Visit Diagnoses:  1. Lumbar radiculopathy   2. Post laminectomy syndrome   3. Scoliosis of lumbar spine, unspecified scoliosis type   4. Myofascial pain syndrome     Plan: No additional findings.   Meds & Orders:  Meds ordered this encounter    Medications  . betamethasone acetate-betamethasone sodium phosphate (CELESTONE) injection 12 mg    Orders Placed This Encounter  Procedures  . XR C-ARM NO REPORT  . Epidural Steroid injection    Follow-up: Return if symptoms worsen or fail to improve.   Procedures: No procedures performed  Lumbosacral Transforaminal Epidural Steroid Injection - Sub-Pedicular Approach with Fluoroscopic Guidance  Patient: Kara [name]      Date of Birth: Aug 14, 1950 MRN: [id number] PCP: Fanny Bien, MD      Visit Date: 10/27/2017   Universal Protocol:    Date/Time: 10/27/2017  Consent Given By: the patient  Position: PRONE  Additional Comments: Vital signs were monitored before and after the procedure. Patient was prepped and draped in the usual sterile fashion. The correct patient, procedure, and site was verified.   Injection Procedure Details:  Procedure Site One Meds Administered:  Meds ordered this encounter  Medications  . betamethasone acetate-betamethasone sodium phosphate (CELESTONE) injection 12 mg    Laterality: Right  Location/Site:  L4-L5  Needle size: 22 G  Needle type: Spinal  Needle Placement: Transforaminal  Findings:    -Comments: Excellent flow of contrast along the nerve and into the epidural space.  Procedure Details: After squaring off the end-plates to get a true AP view, the C-arm was positioned so that an oblique view of the foramen  as noted above was visualized. The target area is just inferior to the "nose of the scotty dog" or sub pedicular. The soft tissues overlying this structure were infiltrated with 2-3 ml. of 1% Lidocaine without Epinephrine.  The spinal needle was inserted toward the target using a "trajectory" view along the fluoroscope beam.  Under AP and lateral visualization, the needle was advanced so it did not puncture dura and was located close the 6 O'Clock position of the pedical in AP tracterory. Biplanar projections  were used to confirm position. Aspiration was confirmed to be negative for CSF and/or blood. A 1-2 ml. volume of Isovue-250 was injected and flow of contrast was noted at each level. Radiographs were obtained for documentation purposes.   After attaining the desired flow of contrast documented above, a 0.5 to 1.0 ml test dose of 0.25% Marcaine was injected into each respective transforaminal space.  The patient was observed for 90 seconds post injection.  After no sensory deficits were reported, and normal lower extremity motor function was noted,   the above injectate was administered so that equal amounts of the injectate were placed at each foramen (level) into the transforaminal epidural space.   Additional Comments:  The patient tolerated the procedure well Dressing: Band-Aid    Post-procedure details: Patient was observed during the procedure. Post-procedure instructions were reviewed.  Patient left the clinic in stable condition.    Clinical History: Lumbar spine MRI W/WO Contrast 09/06/2016 FINDINGS: Segmentation:  Normal, as demonstrated on the recent radiographs.  Alignment: Moderate levoconvex lumbar scoliosis, apex at L3-L4, and straightening of lumbar lordosis have mildly progressed since 2008.  Vertebrae: Rightward degenerative endplate and vertebral body marrow edema at L4 (series 7, image 7). Otherwise bone marrow signal is heterogeneous but within normal limits. Visible sacrum is intact. Negative left SI joint.  Paraspinal and other soft tissues: Negative.  Disc levels:  T11-T12: Mild facet hypertrophy.  T12-L1: Subtle central disc protrusion (series 13, image 1). Mild left facet hypertrophy. No stenosis.  L1-L2: Small broad-based right paracentral disc protrusion (series 13, image 4). Mild to moderate facet and ligament flavum hypertrophy. No stenosis.  L2-L3: Disc space loss since 2008. Increased circumferential disc bulge and endplate spurring  with broad-based posterior component. Mild facet hypertrophy is not significantly changed. Increased mass effect on the ventral thecal sac. Borderline to mild spinal stenosis is new since 2008 but there is no convincing lateral recess or foraminal stenosis.  L3-L4: Disc space loss with bulky circumferential but right eccentric disc bulge and endplate spurring since 2008. Increased moderate facet hypertrophy. New moderate to severe right lateral recess stenosis (descending right L4 nerve level series 13, image 16). New mild to moderate spinal stenosis. New mild to moderate right L3 foraminal stenosis. No significant left foraminal or low lateral recess stenosis.  L4-L5: Previous postoperative changes to the right lamina. Mild interval disc space loss. Increased right far lateral disc bulge and endplate spurring. Relatively stable moderate residual facet hypertrophy. Stable thecal sac patency, no spinal or lateral recess stenosis. However, bilateral neural foraminal stenosis has developed and is mild to moderate (left side series 14, image 12 and right side series 14, image 4).  L5-S1: Better preserved disc space height at this level since 2008, but progressed left far lateral disc bulging and endplate spurring. Furthermore, there is loss of the normal fat signal from the left L5 neural foramen best seen on series 10, images 26 and 27, obscuring the exiting left L5 nerve. Interval increased moderate  bilateral facet hypertrophy. Possible previous postoperative changes to the right lamina at this level. Thecal sac remains patent without spinal stenosis. There is new mild left lateral recess stenosis (descending left S1 nerve root level series 13, image 28).  IMPRESSION: 1. Symptomatic level with regard to left side pain favored to be L5-S1 where a left foraminal disc herniation is suspected, and superimposed on progressed left far lateral disc and endplate degeneration since the  2008 MRI. Query left L5 radiculitis. There is also new mild multifactorial left lateral recess stenosis at that level. 2. Progressed levoconvex lumbar scoliosis since 2008 along with increased disc and endplate degeneration also at L2-L3 through L4-L5. But increased neural impingement at those levels is primarily right-sided. There is new mild to moderate spinal stenosis at L2-L3 and L3-L4. 3. Prior postoperative changes at L4-L5. No recurrent spinal stenosis but new mild to moderate bilateral L4 foraminal stenosis.   She reports that she has never smoked. She has never used smokeless tobacco. No results for input(s): HGBA1C, LABURIC in the last 8760 hours.  Objective:  VS:  HT:    WT:   BMI:     BP:136/85  HR:82bpm  TEMP: ( )  RESP:  Physical Exam  Constitutional: She is oriented to person, place, and time.  Musculoskeletal:  Patient ambulates without aid with good distal strength.  Neurological: She is alert and oriented to person, place, and time. She exhibits normal muscle tone. Coordination normal.    Ortho Exam Imaging: No results found.  Past Medical/Family/Surgical/Social History: Medications & Allergies reviewed per EMR, new medications updated. Patient Active Problem List   Diagnosis Date Noted  . Anxiety state, unspecified 09/04/2011   Past Medical History:  Diagnosis Date  . Depressive disorder, not elsewhere classified    situational (divorce, work stress)  . Herpes simplex labialis   . Osteopenia   . Seasonal allergies    Family History  Problem Relation Age of Onset  . Heart disease Mother        diagnosed in her 54's.  s/p CABG  . Cancer Mother 55       breast cancer  . Breast cancer Mother   . Alcohol abuse Father   . Diabetes Father        refused to be treated  . COPD Sister   . Diabetes Sister   . Diabetes Brother   . Diabetes Brother   . Alcohol abuse Brother        stopped when diagnosed with DM  . Cerebral aneurysm Maternal Aunt   .  Heart disease Maternal Uncle   . Cancer Maternal Grandmother        lymphoma  . Heart disease Maternal Grandfather    Past Surgical History:  Procedure Laterality Date  . BILATERAL SALPINGOOPHORECTOMY  2004   done with hysterectomy  . BUNIONECTOMY  2007   and hammertoe surgery L  . BUNIONECTOMY     R in [place], [place]  . CESAREAN SECTION  x2   [place]  . LAPAROSCOPIC ASSISTED VAGINAL HYSTERECTOMY  2004  . LUMBAR DISC SURGERY     Social History   Occupational History  . Occupation: International aid/development worker (for physicians)    Employer: [employer]  Tobacco Use  . Smoking status: Never Smoker  . Smokeless tobacco: Never Used  Substance and Sexual Activity  . Alcohol use: Yes    Comment: 2 glasses of wine per evening.  . Drug use: No  . Sexual activity: Not on file

## 2017-11-24 ENCOUNTER — Telehealth (INDEPENDENT_AMBULATORY_CARE_PROVIDER_SITE_OTHER): Payer: Self-pay | Admitting: Physical Medicine and Rehabilitation

## 2017-11-24 NOTE — Telephone Encounter (Signed)
Ov to talk and try to get game plan. I am a back specialist but if she would like to see surgeon we can refer her.

## 2017-11-24 NOTE — Telephone Encounter (Signed)
Scheduled for 11/25/17 at 0815.

## 2017-11-25 ENCOUNTER — Ambulatory Visit (INDEPENDENT_AMBULATORY_CARE_PROVIDER_SITE_OTHER): Payer: PPO | Admitting: Physical Medicine and Rehabilitation

## 2017-11-25 ENCOUNTER — Encounter (INDEPENDENT_AMBULATORY_CARE_PROVIDER_SITE_OTHER): Payer: Self-pay | Admitting: Physical Medicine and Rehabilitation

## 2017-11-25 VITALS — BP 128/80 | HR 78 | Temp 98.1°F | Ht 63.0 in | Wt 159.0 lb

## 2017-11-25 DIAGNOSIS — M48062 Spinal stenosis, lumbar region with neurogenic claudication: Secondary | ICD-10-CM

## 2017-11-25 DIAGNOSIS — M25551 Pain in right hip: Secondary | ICD-10-CM | POA: Diagnosis not present

## 2017-11-25 DIAGNOSIS — M419 Scoliosis, unspecified: Secondary | ICD-10-CM | POA: Diagnosis not present

## 2017-11-25 DIAGNOSIS — M25552 Pain in left hip: Secondary | ICD-10-CM | POA: Diagnosis not present

## 2017-11-25 DIAGNOSIS — M961 Postlaminectomy syndrome, not elsewhere classified: Secondary | ICD-10-CM

## 2017-11-25 DIAGNOSIS — M5416 Radiculopathy, lumbar region: Secondary | ICD-10-CM

## 2017-11-25 NOTE — Progress Notes (Signed)
.  Numeric Pain Rating Scale and Functional Assessment Average Pain 8 Pain Right Now 5 My pain is constant, dull and aching Pain is worse with: bending and some activites Pain improves with: heat/ice   In the last MONTH (on 0-10 scale) has pain interfered with the following?  1. General activity like being  able to carry out your everyday physical activities such as walking, climbing stairs, carrying groceries, or moving a chair?  Rating(7)  2. Relation with others like being able to carry out your usual social activities and roles such as  activities at home, at work and in your community. Rating(5)  3. Enjoyment of life such that you have  been bothered by emotional problems such as feeling anxious, depressed or irritable?  Rating(1)

## 2017-11-26 ENCOUNTER — Encounter (INDEPENDENT_AMBULATORY_CARE_PROVIDER_SITE_OTHER): Payer: Self-pay | Admitting: Physical Medicine and Rehabilitation

## 2017-11-26 NOTE — Progress Notes (Signed)
Kara [name] - 67 y.o. female MRN [id number]  Date of birth: 07-20-1950  Office Visit Note: Visit Date: 11/25/2017 PCP: Fanny Bien, MD Referred by: Fanny Bien, MD  Subjective: Chief Complaint  Patient presents with  . Lower Back - Pain  . Right Leg - Pain  . Left Leg - Pain   HPI: Kara [name] is a 67 year old female well-known to me over the last several years as we have seen her intermittently for injection and interventional treatment and follow-up for her back and hip and leg pain.  She is had a prior lumbar laminectomy on the right at L4-5 and updated MRI shows multilevel facet arthropathy with right-sided lateral recess narrowing and scoliosis at L3-4 but also left-sided foraminal compression at L5.  The L5 area is potentially a surgical issue but she did extremely well with prior left L5 transforaminal injection a year ago.  Since that time she has had intermittent flareups that we have treated with therapy as well as some medication trials although she is been really intolerant of some medications in the past.  She does have some level of generalized anxiety which I think does cause her to get very frustrated with the amount of pain she is having and worrying about what she can and cannot do.  She still tries to stay pretty active.  She comes in today because she wanted to follow-up with concerns of just worsening back and hip pain with left-sided radicular pain down to the ankle and a pretty classic L5 distribution posterior lateral leg to the ankle.  Pain on the right side of the hip really more of an L4 may be L3 distribution if it is nerve related.  We had one episode where she had really classic hip pain with groin pain with rotation she followed up with Dr. Ninfa Linden he felt like at the time she was doing better so he really just wanted to watch her as needed.  I do feel like at that time she clearly had a hip related issue but normal appearing x-rays with just mild  arthritis.  Could have had some labral tear issue but doing well with that no pain from that at this point.  She has had no focal weakness no new trauma.  She has had no bowel or bladder changes no red flag complaints.  She rates her pain as 8 out of 10 on average and is a dull aching pain worse with bending and standing and activities.  Ice pack seems to help.  We did complete an injection on October 27, 2017 which was a right sided L5 injection and she reports maybe she had some pain relief for just a couple of days and it was not very impressive.  Again this was on the L5 region.   Review of Systems  Constitutional: Negative for chills, fever, malaise/fatigue and weight loss.  HENT: Negative for hearing loss and sinus pain.   Eyes: Negative for blurred vision, double vision and photophobia.  Respiratory: Negative for cough and shortness of breath.   Cardiovascular: Negative for chest pain, palpitations and leg swelling.  Gastrointestinal: Negative for abdominal pain, nausea and vomiting.  Genitourinary: Negative for flank pain.  Musculoskeletal: Positive for back pain and joint pain. Negative for myalgias.  Skin: Negative for itching and rash.  Neurological: Negative for tremors, focal weakness and weakness.  Endo/Heme/Allergies: Negative.   Psychiatric/Behavioral: Negative for depression.  All other systems reviewed and are negative.  Otherwise per HPI.  Assessment & Plan: Visit Diagnoses:  1. Lumbar radiculopathy   2. Post laminectomy syndrome   3. Scoliosis of thoracolumbar spine, unspecified scoliosis type   4. Spinal stenosis of lumbar region with neurogenic claudication   5. Pain in left hip   6. Pain in right hip     Plan: Findings:  Chronic worsening low back pain and bilateral hip pain with some pain on the left that is clearly radicular to the ankle at times.  She has chronic pain syndrome and prior history of lumbar laminectomy and postlaminectomy syndrome.  She did well last  year with a left L5 transforaminal injection and really that the pain has not really returned in quite a while up until just recently.  She seems to get somewhat confused on just what pain may be coming from what issue because she gets somewhat overwhelmed with the fact that she is just frustrated with the amount of pain that she is having.  At this point we had a long conversation today more than 25 minutes concerning her back and we reviewed images and we talked about referrals to a surgeons.  I think the best plan after talking with her is to complete a left L5 transforaminal epidural steroid injection in her right elbow 3 or L4 transforaminal injection to get both areas seen on the MRI that could be causing her pain.  Conversely she may have some axial back pain from facet arthropathy.  She obviously has some myofascial type pain as well.  Nonetheless if she does not get much relief with the injection we would talk about possibly looking at an adjunctive medication such as nerve membrane stabilizing medications or norepinephrine medications.  We briefly discussed this.  We may also refer her to a neurosurgeon for evaluation.  She does have a level of scoliosis which probably would result in the need for a fusion if a surgeon saw her and that would probably be a bigger ordeal for her.  Lastly she may ultimately be a candidate for spinal cord stimulator trial we only briefly discussed this.    Meds & Orders: No orders of the defined types were placed in this encounter.  No orders of the defined types were placed in this encounter.   Follow-up: Return for Left L5 and right L3 transforaminal epidural steroid injection.   Procedures: No procedures performed  No notes on file   Clinical History: Lumbar spine MRI W/WO Contrast 09/06/2016 FINDINGS: Segmentation:  Normal, as demonstrated on the recent radiographs.  Alignment: Moderate levoconvex lumbar scoliosis, apex at L3-L4, and straightening of  lumbar lordosis have mildly progressed since 2008.  Vertebrae: Rightward degenerative endplate and vertebral body marrow edema at L4 (series 7, image 7). Otherwise bone marrow signal is heterogeneous but within normal limits. Visible sacrum is intact. Negative left SI joint.  Paraspinal and other soft tissues: Negative.  Disc levels:  T11-T12: Mild facet hypertrophy.  T12-L1: Subtle central disc protrusion (series 13, image 1). Mild left facet hypertrophy. No stenosis.  L1-L2: Small broad-based right paracentral disc protrusion (series 13, image 4). Mild to moderate facet and ligament flavum hypertrophy. No stenosis.  L2-L3: Disc space loss since 2008. Increased circumferential disc bulge and endplate spurring with broad-based posterior component. Mild facet hypertrophy is not significantly changed. Increased mass effect on the ventral thecal sac. Borderline to mild spinal stenosis is new since 2008 but there is no convincing lateral recess or foraminal stenosis.  L3-L4: Disc space loss with bulky circumferential but right  eccentric disc bulge and endplate spurring since 2008. Increased moderate facet hypertrophy. New moderate to severe right lateral recess stenosis (descending right L4 nerve level series 13, image 16). New mild to moderate spinal stenosis. New mild to moderate right L3 foraminal stenosis. No significant left foraminal or low lateral recess stenosis.  L4-L5: Previous postoperative changes to the right lamina. Mild interval disc space loss. Increased right far lateral disc bulge and endplate spurring. Relatively stable moderate residual facet hypertrophy. Stable thecal sac patency, no spinal or lateral recess stenosis. However, bilateral neural foraminal stenosis has developed and is mild to moderate (left side series 14, image 12 and right side series 14, image 4).  L5-S1: Better preserved disc space height at this level since 2008, but  progressed left far lateral disc bulging and endplate spurring. Furthermore, there is loss of the normal fat signal from the left L5 neural foramen best seen on series 10, images 26 and 27, obscuring the exiting left L5 nerve. Interval increased moderate bilateral facet hypertrophy. Possible previous postoperative changes to the right lamina at this level. Thecal sac remains patent without spinal stenosis. There is new mild left lateral recess stenosis (descending left S1 nerve root level series 13, image 28).  IMPRESSION: 1. Symptomatic level with regard to left side pain favored to be L5-S1 where a left foraminal disc herniation is suspected, and superimposed on progressed left far lateral disc and endplate degeneration since the 2008 MRI. Query left L5 radiculitis. There is also new mild multifactorial left lateral recess stenosis at that level. 2. Progressed levoconvex lumbar scoliosis since 2008 along with increased disc and endplate degeneration also at L2-L3 through L4-L5. But increased neural impingement at those levels is primarily right-sided. There is new mild to moderate spinal stenosis at L2-L3 and L3-L4. 3. Prior postoperative changes at L4-L5. No recurrent spinal stenosis but new mild to moderate bilateral L4 foraminal stenosis.   She reports that she has never smoked. She has never used smokeless tobacco. No results for input(s): HGBA1C, LABURIC in the last 8760 hours.  Objective:  VS:  HT:5\' 3"  (160 cm)   WT:159 lb (72.1 kg)  BMI:28.17    BP:128/80  HR:78bpm  TEMP:98.1 F (36.7 C)(Oral)  RESP:98 % Physical Exam  Constitutional: She is oriented to person, place, and time. She appears well-developed and well-nourished. No distress.  HENT:  Head: Normocephalic and atraumatic.  Nose: Nose normal.  Mouth/Throat: Oropharynx is clear and moist.  Eyes: Pupils are equal, round, and reactive to light. Conjunctivae are normal.  Neck: Normal range of motion. Neck  supple.  Cardiovascular: Regular rhythm and intact distal pulses.  Pulmonary/Chest: Effort normal. No respiratory distress.  Abdominal: She exhibits no distension. There is no guarding.  Musculoskeletal:  Patient ambulates without aid with a slightly antalgic gait to the left.  She has pain with extension of the lumbar spine and facet joint loading.  She is no pain over the greater trochanters.  No pain with hip rotation internally.  She has good distal strength.  Neurological: She is alert and oriented to person, place, and time. She exhibits normal muscle tone. Coordination normal.  Skin: Skin is warm. No rash noted. No erythema.  Psychiatric: She has a normal mood and affect. Her behavior is normal.  Nursing note and vitals reviewed.   Ortho Exam Imaging: No results found.  Past Medical/Family/Surgical/Social History: Medications & Allergies reviewed per EMR, new medications updated. Patient Active Problem List   Diagnosis Date Noted  . Anxiety state,  unspecified 09/04/2011   Past Medical History:  Diagnosis Date  . Depressive disorder, not elsewhere classified    situational (divorce, work stress)  . Herpes simplex labialis   . Osteopenia   . Seasonal allergies    Family History  Problem Relation Age of Onset  . Heart disease Mother        diagnosed in her 66's.  s/p CABG  . Cancer Mother 42       breast cancer  . Breast cancer Mother   . Alcohol abuse Father   . Diabetes Father        refused to be treated  . COPD Sister   . Diabetes Sister   . Diabetes Brother   . Diabetes Brother   . Alcohol abuse Brother        stopped when diagnosed with DM  . Cerebral aneurysm Maternal Aunt   . Heart disease Maternal Uncle   . Cancer Maternal Grandmother        lymphoma  . Heart disease Maternal Grandfather    Past Surgical History:  Procedure Laterality Date  . BILATERAL SALPINGOOPHORECTOMY  2004   done with hysterectomy  . BUNIONECTOMY  2007   and hammertoe surgery  L  . BUNIONECTOMY     R in [place], [place]  . CESAREAN SECTION  x2   [place]  . LAPAROSCOPIC ASSISTED VAGINAL HYSTERECTOMY  2004  . LUMBAR DISC SURGERY     Social History   Occupational History  . Occupation: International aid/development worker (for physicians)    Employer: [employer]  Tobacco Use  . Smoking status: Never Smoker  . Smokeless tobacco: Never Used  Substance and Sexual Activity  . Alcohol use: Yes    Comment: 2 glasses of wine per evening.  . Drug use: No  . Sexual activity: Not on file

## 2017-11-27 ENCOUNTER — Other Ambulatory Visit: Payer: Self-pay | Admitting: Family Medicine

## 2017-11-27 ENCOUNTER — Ambulatory Visit
Admission: RE | Admit: 2017-11-27 | Discharge: 2017-11-27 | Disposition: A | Discharge status: Home / Self Care | Payer: PPO | Source: Ambulatory Visit | Attending: Family Medicine | Admitting: Family Medicine

## 2017-11-27 DIAGNOSIS — S82832A Other fracture of upper and lower end of left fibula, initial encounter for closed fracture: Secondary | ICD-10-CM | POA: Diagnosis not present

## 2017-11-27 DIAGNOSIS — I10 Essential (primary) hypertension: Secondary | ICD-10-CM | POA: Diagnosis not present

## 2017-11-27 DIAGNOSIS — Z6828 Body mass index (BMI) 28.0-28.9, adult: Secondary | ICD-10-CM | POA: Diagnosis not present

## 2017-11-27 DIAGNOSIS — F331 Major depressive disorder, recurrent, moderate: Secondary | ICD-10-CM | POA: Diagnosis not present

## 2017-11-27 DIAGNOSIS — T1490XA Injury, unspecified, initial encounter: Secondary | ICD-10-CM

## 2017-11-27 DIAGNOSIS — F411 Generalized anxiety disorder: Secondary | ICD-10-CM | POA: Diagnosis not present

## 2017-11-27 DIAGNOSIS — S93402A Sprain of unspecified ligament of left ankle, initial encounter: Secondary | ICD-10-CM | POA: Diagnosis not present

## 2017-12-11 ENCOUNTER — Ambulatory Visit (INDEPENDENT_AMBULATORY_CARE_PROVIDER_SITE_OTHER): Payer: PPO | Admitting: Physical Medicine and Rehabilitation

## 2017-12-11 ENCOUNTER — Ambulatory Visit (INDEPENDENT_AMBULATORY_CARE_PROVIDER_SITE_OTHER): Payer: Self-pay

## 2017-12-11 ENCOUNTER — Ambulatory Visit (INDEPENDENT_AMBULATORY_CARE_PROVIDER_SITE_OTHER): Payer: PPO

## 2017-12-11 ENCOUNTER — Encounter (INDEPENDENT_AMBULATORY_CARE_PROVIDER_SITE_OTHER): Payer: Self-pay | Admitting: Physical Medicine and Rehabilitation

## 2017-12-11 VITALS — BP 149/92 | HR 93

## 2017-12-11 DIAGNOSIS — M25572 Pain in left ankle and joints of left foot: Secondary | ICD-10-CM

## 2017-12-11 DIAGNOSIS — M5416 Radiculopathy, lumbar region: Secondary | ICD-10-CM

## 2017-12-11 DIAGNOSIS — S82302S Unspecified fracture of lower end of left tibia, sequela: Secondary | ICD-10-CM

## 2017-12-11 MED ORDER — BETAMETHASONE SOD PHOS & ACET 6 (3-3) MG/ML IJ SUSP
12.0000 mg | Freq: Once | INTRAMUSCULAR | Status: AC
  Administered 2017-12-11: 12 mg

## 2017-12-11 NOTE — Progress Notes (Signed)
 .  Numeric Pain Rating Scale and Functional Assessment Average Pain 8   In the last MONTH (on 0-10 scale) has pain interfered with the following?  1. General activity like being  able to carry out your everyday physical activities such as walking, climbing stairs, carrying groceries, or moving a chair?  Rating(6)   +Driver, -BT, -Dye Allergies.  

## 2017-12-11 NOTE — Patient Instructions (Signed)

## 2017-12-30 NOTE — Progress Notes (Signed)
Kara [name] - 67 y.o. female MRN [id number]  Date of birth: March 03, 1951  Office Visit Note: Visit Date: 12/11/2017 PCP: Fanny Bien, MD Referred by: Fanny Bien, MD  Subjective: Chief Complaint  Patient presents with  . Lower Back - Pain  . Right Hip - Pain  . Left Hip - Pain   HPI: Kara [name] is a 67 year old female that was coming in today for planned transforaminal epidural steroid injection for her chronic worsening hip and leg pain.  As per our last office visit she is having bilateral hip and leg pain.  Pain really started more than a year ago and this is all documented in our prior note.  When we last saw her she was complaining of some ankle pain she went on to have this evaluated and had a small avulsion fracture of the distal tibia.  X-rays were performed and she has been wearing a protective shoe and was protected weightbearing for a while.  She had asked if we could x-ray her ankle today and at least let her know if she can be out of the shoe that she was wearing.  She is reported decreased swelling and decreased pain of the ankle.  We did obtain x-rays today and these are reviewed below.  I did curbside Dr. Sharol Given in our office from a foot and ankle expert standpoint and he remarked that it would like it was doing well and she really could go back to increasing her activities and that should heal well.  This was relayed to her.   Review of Systems  Musculoskeletal: Positive for back pain and joint pain.   Otherwise per HPI.  Assessment & Plan: Visit Diagnoses:  1. Pain in left ankle and joints of left foot   2. Lumbar radiculopathy   3. Closed extra-articular fracture of distal end of left tibia, sequela     Plan: Findings:  Planned epidural injection today was performed as dictated separately.  Brief evaluation management of left ankle pain with small distal avulsion fracture showed x-rays with some healing and decreased swelling.  She is to go ahead  and do activity as tolerated in terms of her left ankle.  Should anything change she can follow-up with [place] and we can have her see Dr. Sharol Given in the office.    Meds & Orders:  Meds ordered this encounter  Medications  . betamethasone acetate-betamethasone sodium phosphate (CELESTONE) injection 12 mg    Orders Placed This Encounter  Procedures  . XR Ankle Complete Left  . XR C-ARM NO REPORT  . Epidural Steroid injection    Follow-up: Return if symptoms worsen or fail to improve.   Procedures: No procedures performed  Lumbosacral Transforaminal Epidural Steroid Injection - Sub-Pedicular Approach with Fluoroscopic Guidance  Patient: Kara [name]      Date of Birth: [birth year] MRN: [id number] PCP: Fanny Bien, MD      Visit Date: 12/11/2017   Universal Protocol:    Date/Time: 12/11/2017  Consent Given By: the patient  Position: PRONE  Additional Comments: Vital signs were monitored before and after the procedure. Patient was prepped and draped in the usual sterile fashion. The correct patient, procedure, and site was verified.   Injection Procedure Details:  Procedure Site One Meds Administered:  Meds ordered this encounter  Medications  . betamethasone acetate-betamethasone sodium phosphate (CELESTONE) injection 12 mg    Laterality: Right , Left  Location/Site:  L3-L4, L5-S1  Needle size: 22 G  Needle type: Spinal  Needle Placement: Transforaminal  Findings:    -Comments: Excellent flow of contrast along the nerve and into the epidural space.  Initial part of the procedure on the right at L3 with just numbing the skin and then needling into the muscle may be a quarter of an inch from the dermis the patient had a sharp pain that was clearly a myofascial type pain almost like a trigger point type twitch.  She seemed at that point to get more anxious in any needling at that point seem to be painful.  Biplanar imaging was used to assure that we were  nowhere near any structures other than muscle.  We did use more anesthetic and the patient was able to finish the procedure on that side fairly well.  She has had injections in the past without such reaction.  I did try to reassure her that there was really no issue other than probably a myofascial trigger point response in that region.  Procedure Details: After squaring off the end-plates to get a true AP view, the C-arm was positioned so that an oblique view of the foramen as noted above was visualized. The target area is just inferior to the "nose of the scotty dog" or sub pedicular. The soft tissues overlying this structure were infiltrated with 2-3 ml. of 1% Lidocaine without Epinephrine.  The spinal needle was inserted toward the target using a "trajectory" view along the fluoroscope beam.  Under AP and lateral visualization, the needle was advanced so it did not puncture dura and was located close the 6 O'Clock position of the pedical in AP tracterory. Biplanar projections were used to confirm position. Aspiration was confirmed to be negative for CSF and/or blood. A 1-2 ml. volume of Isovue-250 was injected and flow of contrast was noted at each level. Radiographs were obtained for documentation purposes.   After attaining the desired flow of contrast documented above, a 0.5 to 1.0 ml test dose of 0.25% Marcaine was injected into each respective transforaminal space.  The patient was observed for 90 seconds post injection.  After no sensory deficits were reported, and normal lower extremity motor function was noted,   the above injectate was administered so that equal amounts of the injectate were placed at each foramen (level) into the transforaminal epidural space.   Additional Comments:  No complications occurred Dressing: Band-Aid    Post-procedure details: Patient was observed during the procedure. Post-procedure instructions were reviewed.  Patient left the clinic in stable  condition.    Clinical History: Lumbar spine MRI W/WO Contrast 09/06/2016 FINDINGS: Segmentation:  Normal, as demonstrated on the recent radiographs.  Alignment: Moderate levoconvex lumbar scoliosis, apex at L3-L4, and straightening of lumbar lordosis have mildly progressed since 2008.  Vertebrae: Rightward degenerative endplate and vertebral body marrow edema at L4 (series 7, image 7). Otherwise bone marrow signal is heterogeneous but within normal limits. Visible sacrum is intact. Negative left SI joint.  Paraspinal and other soft tissues: Negative.  Disc levels:  T11-T12: Mild facet hypertrophy.  T12-L1: Subtle central disc protrusion (series 13, image 1). Mild left facet hypertrophy. No stenosis.  L1-L2: Small broad-based right paracentral disc protrusion (series 13, image 4). Mild to moderate facet and ligament flavum hypertrophy. No stenosis.  L2-L3: Disc space loss since 2008. Increased circumferential disc bulge and endplate spurring with broad-based posterior component. Mild facet hypertrophy is not significantly changed. Increased mass effect on the ventral thecal sac. Borderline to mild spinal stenosis is new since  2008 but there is no convincing lateral recess or foraminal stenosis.  L3-L4: Disc space loss with bulky circumferential but right eccentric disc bulge and endplate spurring since 2008. Increased moderate facet hypertrophy. New moderate to severe right lateral recess stenosis (descending right L4 nerve level series 13, image 16). New mild to moderate spinal stenosis. New mild to moderate right L3 foraminal stenosis. No significant left foraminal or low lateral recess stenosis.  L4-L5: Previous postoperative changes to the right lamina. Mild interval disc space loss. Increased right far lateral disc bulge and endplate spurring. Relatively stable moderate residual facet hypertrophy. Stable thecal sac patency, no spinal or lateral  recess stenosis. However, bilateral neural foraminal stenosis has developed and is mild to moderate (left side series 14, image 12 and right side series 14, image 4).  L5-S1: Better preserved disc space height at this level since 2008, but progressed left far lateral disc bulging and endplate spurring. Furthermore, there is loss of the normal fat signal from the left L5 neural foramen best seen on series 10, images 26 and 27, obscuring the exiting left L5 nerve. Interval increased moderate bilateral facet hypertrophy. Possible previous postoperative changes to the right lamina at this level. Thecal sac remains patent without spinal stenosis. There is new mild left lateral recess stenosis (descending left S1 nerve root level series 13, image 28).  IMPRESSION: 1. Symptomatic level with regard to left side pain favored to be L5-S1 where a left foraminal disc herniation is suspected, and superimposed on progressed left far lateral disc and endplate degeneration since the 2008 MRI. Query left L5 radiculitis. There is also new mild multifactorial left lateral recess stenosis at that level. 2. Progressed levoconvex lumbar scoliosis since 2008 along with increased disc and endplate degeneration also at L2-L3 through L4-L5. But increased neural impingement at those levels is primarily right-sided. There is new mild to moderate spinal stenosis at L2-L3 and L3-L4. 3. Prior postoperative changes at L4-L5. No recurrent spinal stenosis but new mild to moderate bilateral L4 foraminal stenosis.   She reports that she has never smoked. She has never used smokeless tobacco. No results for input(s): HGBA1C, LABURIC in the last 8760 hours.  Objective:  VS:  HT:    WT:   BMI:     BP:(!) 149/92  HR:93bpm  TEMP: ( )  RESP:  Physical Exam  Constitutional: She is oriented to person, place, and time. She appears well-developed and well-nourished.  Eyes: Pupils are equal, round, and reactive to  light. Conjunctivae and EOM are normal.  Cardiovascular: Normal rate and intact distal pulses.  Pulmonary/Chest: Effort normal.  Musculoskeletal:  Examination of the left ankle shows good ankle range of motion with actually not much swelling at all in the foot or ankle.  Mild tenderness over the distal tibia.  Good strength and neurovascularly intact.  Neurological: She is alert and oriented to person, place, and time. She exhibits normal muscle tone.  Skin: Skin is warm and dry. No rash noted. No erythema.  Psychiatric: She has a normal mood and affect. Her behavior is normal.  Nursing note and vitals reviewed.   Ortho Exam Imaging: No results found.  Past Medical/Family/Surgical/Social History: Medications & Allergies reviewed per EMR, new medications updated. Patient Active Problem List   Diagnosis Date Noted  . Anxiety state, unspecified 09/04/2011   Past Medical History:  Diagnosis Date  . Depressive disorder, not elsewhere classified    situational (divorce, work stress)  . Herpes simplex labialis   . Osteopenia   .  Seasonal allergies    Family History  Problem Relation Age of Onset  . Heart disease Mother        diagnosed in her 51's.  s/p CABG  . Cancer Mother 48       breast cancer  . Breast cancer Mother   . Alcohol abuse Father   . Diabetes Father        refused to be treated  . COPD Sister   . Diabetes Sister   . Diabetes Brother   . Diabetes Brother   . Alcohol abuse Brother        stopped when diagnosed with DM  . Cerebral aneurysm Maternal Aunt   . Heart disease Maternal Uncle   . Cancer Maternal Grandmother        lymphoma  . Heart disease Maternal Grandfather    Past Surgical History:  Procedure Laterality Date  . BILATERAL SALPINGOOPHORECTOMY  2004   done with hysterectomy  . BUNIONECTOMY  2007   and hammertoe surgery L  . BUNIONECTOMY     R in [place], [place]  . CESAREAN SECTION  x2   [place]  . LAPAROSCOPIC ASSISTED VAGINAL HYSTERECTOMY   2004  . LUMBAR DISC SURGERY     Social History   Occupational History  . Occupation: International aid/development worker (for physicians)    Employer: [employer]  Tobacco Use  . Smoking status: Never Smoker  . Smokeless tobacco: Never Used  Substance and Sexual Activity  . Alcohol use: Yes    Comment: 2 glasses of wine per evening.  . Drug use: No  . Sexual activity: Not on file

## 2017-12-30 NOTE — Procedures (Signed)
Lumbosacral Transforaminal Epidural Steroid Injection - Sub-Pedicular Approach with Fluoroscopic Guidance  Patient: Kara [name]      Date of Birth: 1951-04-20 MRN: [id number] PCP: Fanny Bien, MD      Visit Date: 12/11/2017   Universal Protocol:    Date/Time: 12/11/2017  Consent Given By: the patient  Position: PRONE  Additional Comments: Vital signs were monitored before and after the procedure. Patient was prepped and draped in the usual sterile fashion. The correct patient, procedure, and site was verified.   Injection Procedure Details:  Procedure Site One Meds Administered:  Meds ordered this encounter  Medications  . betamethasone acetate-betamethasone sodium phosphate (CELESTONE) injection 12 mg    Laterality: Right , Left  Location/Site:  L3-L4, L5-S1  Needle size: 22 G  Needle type: Spinal  Needle Placement: Transforaminal  Findings:    -Comments: Excellent flow of contrast along the nerve and into the epidural space.  Initial part of the procedure on the right at L3 with just numbing the skin and then needling into the muscle may be a quarter of an inch from the dermis the patient had a sharp pain that was clearly a myofascial type pain almost like a trigger point type twitch.  She seemed at that point to get more anxious in any needling at that point seem to be painful.  Biplanar imaging was used to assure that we were nowhere near any structures other than muscle.  We did use more anesthetic and the patient was able to finish the procedure on that side fairly well.  She has had injections in the past without such reaction.  I did try to reassure her that there was really no issue other than probably a myofascial trigger point response in that region.  Procedure Details: After squaring off the end-plates to get a true AP view, the C-arm was positioned so that an oblique view of the foramen as noted above was visualized. The target area is just  inferior to the "nose of the scotty dog" or sub pedicular. The soft tissues overlying this structure were infiltrated with 2-3 ml. of 1% Lidocaine without Epinephrine.  The spinal needle was inserted toward the target using a "trajectory" view along the fluoroscope beam.  Under AP and lateral visualization, the needle was advanced so it did not puncture dura and was located close the 6 O'Clock position of the pedical in AP tracterory. Biplanar projections were used to confirm position. Aspiration was confirmed to be negative for CSF and/or blood. A 1-2 ml. volume of Isovue-250 was injected and flow of contrast was noted at each level. Radiographs were obtained for documentation purposes.   After attaining the desired flow of contrast documented above, a 0.5 to 1.0 ml test dose of 0.25% Marcaine was injected into each respective transforaminal space.  The patient was observed for 90 seconds post injection.  After no sensory deficits were reported, and normal lower extremity motor function was noted,   the above injectate was administered so that equal amounts of the injectate were placed at each foramen (level) into the transforaminal epidural space.   Additional Comments:  No complications occurred Dressing: Band-Aid    Post-procedure details: Patient was observed during the procedure. Post-procedure instructions were reviewed.  Patient left the clinic in stable condition.

## 2018-01-23 ENCOUNTER — Telehealth (INDEPENDENT_AMBULATORY_CARE_PROVIDER_SITE_OTHER): Payer: Self-pay | Admitting: Radiology

## 2018-01-23 ENCOUNTER — Other Ambulatory Visit (INDEPENDENT_AMBULATORY_CARE_PROVIDER_SITE_OTHER): Payer: Self-pay | Admitting: Radiology

## 2018-01-23 DIAGNOSIS — M5416 Radiculopathy, lumbar region: Secondary | ICD-10-CM

## 2018-01-23 NOTE — Telephone Encounter (Signed)
Patient left voicemail requesting new MRI of lumbar spine, states having unbearable increased pain has had 3 falls since last MRI (09/05/16), last office visit with patient was 11/25/17 with left ankle and hip x-rays.  She is in [employer] until 02/09/18.  Is new MRI order ok or does patient need an office visit first?  Call back # [phone]

## 2018-01-23 NOTE — Telephone Encounter (Signed)
Order lspine mri ok, diagnosis is lumbar radic and postlaminectomy syndrome, f/up after MRI

## 2018-01-23 NOTE — Telephone Encounter (Signed)
Called patient to inform her MRI order has been placed, we will get authorization from insurance and then [facility] with schedule patient.  She is aware to call office once MRI is scheduled to make f/u appt with Dr. Ernestina Patches to go over results.

## 2018-02-04 NOTE — Telephone Encounter (Signed)
error 

## 2018-02-06 ENCOUNTER — Telehealth (INDEPENDENT_AMBULATORY_CARE_PROVIDER_SITE_OTHER): Payer: Self-pay | Admitting: Radiology

## 2018-02-06 NOTE — Telephone Encounter (Signed)
Patient left vm stating appointment for wed. 02/18/18 at 9:30am was ok & she will see Dr. Ernestina Patches then.

## 2018-02-09 ENCOUNTER — Ambulatory Visit
Admission: RE | Admit: 2018-02-09 | Discharge: 2018-02-09 | Disposition: A | Discharge status: Home / Self Care | Payer: PPO | Source: Ambulatory Visit | Attending: Physical Medicine and Rehabilitation | Admitting: Physical Medicine and Rehabilitation

## 2018-02-09 DIAGNOSIS — M48061 Spinal stenosis, lumbar region without neurogenic claudication: Secondary | ICD-10-CM | POA: Diagnosis not present

## 2018-02-09 DIAGNOSIS — M5416 Radiculopathy, lumbar region: Secondary | ICD-10-CM

## 2018-02-18 ENCOUNTER — Ambulatory Visit (INDEPENDENT_AMBULATORY_CARE_PROVIDER_SITE_OTHER): Payer: PPO | Admitting: Physical Medicine and Rehabilitation

## 2018-02-18 ENCOUNTER — Encounter (INDEPENDENT_AMBULATORY_CARE_PROVIDER_SITE_OTHER): Payer: Self-pay | Admitting: Physical Medicine and Rehabilitation

## 2018-02-18 VITALS — BP 154/88 | HR 81 | Ht 63.0 in | Wt 158.0 lb

## 2018-02-18 DIAGNOSIS — M48062 Spinal stenosis, lumbar region with neurogenic claudication: Secondary | ICD-10-CM

## 2018-02-18 DIAGNOSIS — M961 Postlaminectomy syndrome, not elsewhere classified: Secondary | ICD-10-CM

## 2018-02-18 DIAGNOSIS — M419 Scoliosis, unspecified: Secondary | ICD-10-CM | POA: Diagnosis not present

## 2018-02-18 DIAGNOSIS — M5116 Intervertebral disc disorders with radiculopathy, lumbar region: Secondary | ICD-10-CM | POA: Diagnosis not present

## 2018-02-18 NOTE — Progress Notes (Signed)
 .  Numeric Pain Rating Scale and Functional Assessment Average Pain 7 Pain Right Now 7 My pain is constant, burning, stabbing and aching Pain is worse with: sitting and some activites Pain improves with: therapy/exercise   In the last MONTH (on 0-10 scale) has pain interfered with the following?  1. General activity like being  able to carry out your everyday physical activities such as walking, climbing stairs, carrying groceries, or moving a chair?  Rating(5)  2. Relation with others like being able to carry out your usual social activities and roles such as  activities at home, at work and in your community. Rating(4)  3. Enjoyment of life such that you have  been bothered by emotional problems such as feeling anxious, depressed or irritable?  Rating(3)

## 2018-03-10 ENCOUNTER — Ambulatory Visit (INDEPENDENT_AMBULATORY_CARE_PROVIDER_SITE_OTHER): Payer: Self-pay

## 2018-03-10 ENCOUNTER — Encounter (INDEPENDENT_AMBULATORY_CARE_PROVIDER_SITE_OTHER): Payer: Self-pay | Admitting: Physical Medicine and Rehabilitation

## 2018-03-10 ENCOUNTER — Ambulatory Visit (INDEPENDENT_AMBULATORY_CARE_PROVIDER_SITE_OTHER): Payer: PPO | Admitting: Physical Medicine and Rehabilitation

## 2018-03-10 VITALS — BP 158/95 | HR 73 | Temp 97.7°F

## 2018-03-10 DIAGNOSIS — M5416 Radiculopathy, lumbar region: Secondary | ICD-10-CM | POA: Diagnosis not present

## 2018-03-10 MED ORDER — BETAMETHASONE SOD PHOS & ACET 6 (3-3) MG/ML IJ SUSP
12.0000 mg | Freq: Once | INTRAMUSCULAR | Status: AC
  Administered 2018-03-10: 12 mg

## 2018-03-10 NOTE — Progress Notes (Signed)
.     Numeric Pain Rating Scale and Functional Assessment Average Pain 10   In the last MONTH (on 0-10 scale) has pain interfered with the following?  1. General activity like being  able to carry out your everyday physical activities such as walking, climbing stairs, carrying groceries, or moving a chair?  Rating(7)   +Driver, -BT, -Dye Allergies.  

## 2018-03-10 NOTE — Patient Instructions (Signed)

## 2018-03-12 ENCOUNTER — Other Ambulatory Visit: Payer: Self-pay | Admitting: Family Medicine

## 2018-03-12 DIAGNOSIS — Z1231 Encounter for screening mammogram for malignant neoplasm of breast: Secondary | ICD-10-CM

## 2018-03-12 DIAGNOSIS — E559 Vitamin D deficiency, unspecified: Secondary | ICD-10-CM | POA: Diagnosis not present

## 2018-03-12 DIAGNOSIS — E782 Mixed hyperlipidemia: Secondary | ICD-10-CM | POA: Diagnosis not present

## 2018-03-12 DIAGNOSIS — I10 Essential (primary) hypertension: Secondary | ICD-10-CM | POA: Diagnosis not present

## 2018-03-13 DIAGNOSIS — H02831 Dermatochalasis of right upper eyelid: Secondary | ICD-10-CM | POA: Diagnosis not present

## 2018-03-13 DIAGNOSIS — H02834 Dermatochalasis of left upper eyelid: Secondary | ICD-10-CM | POA: Diagnosis not present

## 2018-03-17 ENCOUNTER — Other Ambulatory Visit: Payer: Self-pay | Admitting: Family Medicine

## 2018-03-17 DIAGNOSIS — I10 Essential (primary) hypertension: Secondary | ICD-10-CM | POA: Diagnosis not present

## 2018-03-17 DIAGNOSIS — Z6828 Body mass index (BMI) 28.0-28.9, adult: Secondary | ICD-10-CM | POA: Diagnosis not present

## 2018-03-17 DIAGNOSIS — R5381 Other malaise: Secondary | ICD-10-CM

## 2018-03-17 DIAGNOSIS — Z0000 Encounter for general adult medical examination without abnormal findings: Secondary | ICD-10-CM | POA: Diagnosis not present

## 2018-03-17 DIAGNOSIS — E559 Vitamin D deficiency, unspecified: Secondary | ICD-10-CM | POA: Diagnosis not present

## 2018-03-17 DIAGNOSIS — E782 Mixed hyperlipidemia: Secondary | ICD-10-CM | POA: Diagnosis not present

## 2018-03-23 NOTE — Progress Notes (Signed)
Kara [name] - 67 y.o. female MRN [id number]  Date of birth: [birth year]  Office Visit Note: Visit Date: 03/10/2018 PCP: Fanny Bien, MD Referred by: Fanny Bien, MD  Subjective: Chief Complaint  Patient presents with  . Lower Back - Pain  . Left Thigh - Pain  . Left Hip - Pain   HPI:  Kara [name] is a 67 y.o. female who comes in today For planned left L3 transforaminal epidural injection and left L5-S1 interlaminar injection.  She has pretty significant stenosis at L4-5 and foraminal narrowing at L3.  Pain in the left hip and groin and leg.  Prior hip injection and one point was diagnostic but she has been followed by Dr. Ninfa Linden who felt like it really was not her hip at that point.  ROS Otherwise per HPI.  Assessment & Plan: Visit Diagnoses:  1. Lumbar radiculopathy     Plan: No additional findings.   Meds & Orders:  Meds ordered this encounter  Medications  . betamethasone acetate-betamethasone sodium phosphate (CELESTONE) injection 12 mg    Orders Placed This Encounter  Procedures  . XR C-ARM NO REPORT  . Epidural Steroid injection    Follow-up: Return if symptoms worsen or fail to improve.   Procedures: No procedures performed  Lumbosacral Transforaminal Epidural Steroid Injection - Sub-Pedicular Approach with Fluoroscopic Guidance  Patient: Kara [name]      Date of Birth: [birth year] MRN: [id number] PCP: Fanny Bien, MD      Visit Date: 03/10/2018   Universal Protocol:    Date/Time: 03/10/2018  Consent Given By: the patient  Position: PRONE  Additional Comments: Vital signs were monitored before and after the procedure. Patient was prepped and draped in the usual sterile fashion. The correct patient, procedure, and site was verified.   Injection Procedure Details:  Procedure Site One Meds Administered:  Meds ordered this encounter  Medications  . betamethasone acetate-betamethasone sodium phosphate  (CELESTONE) injection 12 mg   **Medication was split equally among the transforaminal and interlaminar epidural injection.  Laterality: Left  Location/Site:  L3-L4  Needle size: 70 G  Needle type: Spinal  Needle Placement: Transforaminal  Findings:    -Comments: Excellent flow of contrast along the nerve and into the epidural space.  Procedure Details: After squaring off the end-plates to get a true AP view, the C-arm was positioned so that an oblique view of the foramen as noted above was visualized. The target area is just inferior to the "nose of the scotty dog" or sub pedicular. The soft tissues overlying this structure were infiltrated with 2-3 ml. of 1% Lidocaine without Epinephrine.  The spinal needle was inserted toward the target using a "trajectory" view along the fluoroscope beam.  Under AP and lateral visualization, the needle was advanced so it did not puncture dura and was located close the 6 O'Clock position of the pedical in AP tracterory. Biplanar projections were used to confirm position. Aspiration was confirmed to be negative for CSF and/or blood. A 1-2 ml. volume of Isovue-250 was injected and flow of contrast was noted at each level. Radiographs were obtained for documentation purposes.   After attaining the desired flow of contrast documented above, a 0.5 to 1.0 ml test dose of 0.25% Marcaine was injected into each respective transforaminal space.  The patient was observed for 90 seconds post injection.  After no sensory deficits were reported, and normal lower extremity motor function was noted,   the above injectate was  administered so that equal amounts of the injectate were placed at each foramen (level) into the transforaminal epidural space.   Additional Comments:  The patient tolerated the procedure well Dressing: Band-Aid    Post-procedure details: Patient was observed during the procedure. Post-procedure instructions were reviewed.  Patient left the  clinic in stable condition.  Lumbar Epidural Steroid Injection - Interlaminar Approach with Fluoroscopic Guidance  Patient: Kara [name]      Date of Birth: [birth year] MRN: [id number] PCP: Fanny Bien, MD      Visit Date: 03/10/2018   Universal Protocol:     Consent Given By: the patient  Position: PRONE  Additional Comments: Vital signs were monitored before and after the procedure. Patient was prepped and draped in the usual sterile fashion. The correct patient, procedure, and site was verified.   Injection Procedure Details:  Procedure Site One Meds Administered:  Meds ordered this encounter  Medications  . betamethasone acetate-betamethasone sodium phosphate (CELESTONE) injection 12 mg     Laterality: Left  Location/Site:  L5-S1  Needle size: 20 G  Needle type: Tuohy  Needle Placement: Paramedian epidural  Findings:   -Comments: Excellent flow of contrast into the epidural space.  Procedure Details: Using a paramedian approach from the side mentioned above, the region overlying the inferior lamina was localized under fluoroscopic visualization and the soft tissues overlying this structure were infiltrated with 4 ml. of 1% Lidocaine without Epinephrine. The Tuohy needle was inserted into the epidural space using a paramedian approach.   The epidural space was localized using loss of resistance along with lateral and bi-planar fluoroscopic views.  After negative aspirate for air, blood, and CSF, a 2 ml. volume of Isovue-250 was injected into the epidural space and the flow of contrast was observed. Radiographs were obtained for documentation purposes.    The injectate was administered into the level noted above.   Additional Comments:  The patient tolerated the procedure well Dressing: Band-Aid    Post-procedure details: Patient was observed during the procedure. Post-procedure instructions were reviewed.  Patient left the clinic in stable  condition.     Clinical History: MRI LUMBAR SPINE WITHOUT CONTRAST  TECHNIQUE: Multiplanar, multisequence MR imaging of the lumbar spine was performed. No intravenous contrast was administered.  COMPARISON:  09/05/2016  FINDINGS: Segmentation: 5 lumbar type vertebral bodies when compared to prior radiography.  Alignment:  Levoscoliosis and mild L4-5 anterolisthesis.  Vertebrae: Marrow edema about the right aspect of the L4-5 disc space, considered degenerative and a change from 2018. No fracture, discitis, or aggressive bone lesion  Conus medullaris and cauda equina: Conus extends to the L1-2 level. Conus and cauda equina appear normal.  Paraspinal and other soft tissues: Negative  Disc levels:  T12- L1: Tiny central protrusion.  L1-L2: Small central protrusion. Minor facet spurring. No impingement  L2-L3: Spondylosis with disc narrowing and bulging. Patent canal and foramina  L3-L4: Disc narrowing and bulging with endplate ridging. Mild posterior element hypertrophy. Moderate spinal stenosis with asymmetric right subarticular recess narrowing. Moderate right foraminal narrowing. Right foraminal protrusion that is likely subsided from prior, but does touch the right L3 nerve root without deformity.  L4-L5: Spondylosis and endplate degeneration with right preferential bulging. There is spinal stenosis without neural compression status post laminotomy. Right foraminal stenosis with L4 root distortion.  L5-S1:Asymmetric leftward disc narrowing far-lateral spurring. Mild facet spurring. Left foraminal impingement with L5 root distortion due to disc height loss and spur.  IMPRESSION: 1. Multilevel disc and facet  degeneration with levoscoliosis. Degenerative endplate edema is newly seen at L4-5, new from 2018. 2. L5-S1 chronic left foraminal compression. 3. L3-4 and L4-5 moderate right foraminal impingement. 4. L3-4 moderate spinal stenosis. There is  asymmetric right subarticular recess narrowing that could affect the L4 nerve root. 5. L4-5 laminotomy with patent canal.   Electronically Signed   By: Monte Fantasia M.D.   On: 02/09/2018 11:29     Objective:  VS:  HT:    WT:   BMI:     BP:(!) 158/95  HR:73bpm  TEMP:97.7 F (36.5 C)(Oral)  RESP:  Physical Exam  Ortho Exam Imaging: No results found.

## 2018-03-23 NOTE — Procedures (Signed)
Lumbosacral Transforaminal Epidural Steroid Injection - Sub-Pedicular Approach with Fluoroscopic Guidance  Patient: Kara [name]      Date of Birth: 08/26/1950 MRN: [id number] PCP: Fanny Bien, MD      Visit Date: 03/10/2018   Universal Protocol:    Date/Time: 03/10/2018  Consent Given By: the patient  Position: PRONE  Additional Comments: Vital signs were monitored before and after the procedure. Patient was prepped and draped in the usual sterile fashion. The correct patient, procedure, and site was verified.   Injection Procedure Details:  Procedure Site One Meds Administered:  Meds ordered this encounter  Medications  . betamethasone acetate-betamethasone sodium phosphate (CELESTONE) injection 12 mg   **Medication was split equally among the transforaminal and interlaminar epidural injection.  Laterality: Left  Location/Site:  L3-L4  Needle size: 13 G  Needle type: Spinal  Needle Placement: Transforaminal  Findings:    -Comments: Excellent flow of contrast along the nerve and into the epidural space.  Procedure Details: After squaring off the end-plates to get a true AP view, the C-arm was positioned so that an oblique view of the foramen as noted above was visualized. The target area is just inferior to the "nose of the scotty dog" or sub pedicular. The soft tissues overlying this structure were infiltrated with 2-3 ml. of 1% Lidocaine without Epinephrine.  The spinal needle was inserted toward the target using a "trajectory" view along the fluoroscope beam.  Under AP and lateral visualization, the needle was advanced so it did not puncture dura and was located close the 6 O'Clock position of the pedical in AP tracterory. Biplanar projections were used to confirm position. Aspiration was confirmed to be negative for CSF and/or blood. A 1-2 ml. volume of Isovue-250 was injected and flow of contrast was noted at each level. Radiographs were obtained  for documentation purposes.   After attaining the desired flow of contrast documented above, a 0.5 to 1.0 ml test dose of 0.25% Marcaine was injected into each respective transforaminal space.  The patient was observed for 90 seconds post injection.  After no sensory deficits were reported, and normal lower extremity motor function was noted,   the above injectate was administered so that equal amounts of the injectate were placed at each foramen (level) into the transforaminal epidural space.   Additional Comments:  The patient tolerated the procedure well Dressing: Band-Aid    Post-procedure details: Patient was observed during the procedure. Post-procedure instructions were reviewed.  Patient left the clinic in stable condition.  Lumbar Epidural Steroid Injection - Interlaminar Approach with Fluoroscopic Guidance  Patient: Kara [name]      Date of Birth: 05-02-1951 MRN: [id number] PCP: Fanny Bien, MD      Visit Date: 03/10/2018   Universal Protocol:     Consent Given By: the patient  Position: PRONE  Additional Comments: Vital signs were monitored before and after the procedure. Patient was prepped and draped in the usual sterile fashion. The correct patient, procedure, and site was verified.   Injection Procedure Details:  Procedure Site One Meds Administered:  Meds ordered this encounter  Medications  . betamethasone acetate-betamethasone sodium phosphate (CELESTONE) injection 12 mg     Laterality: Left  Location/Site:  L5-S1  Needle size: 20 G  Needle type: Tuohy  Needle Placement: Paramedian epidural  Findings:   -Comments: Excellent flow of contrast into the epidural space.  Procedure Details: Using a paramedian approach from the side mentioned above, the region overlying the inferior  lamina was localized under fluoroscopic visualization and the soft tissues overlying this structure were infiltrated with 4 ml. of 1% Lidocaine without  Epinephrine. The Tuohy needle was inserted into the epidural space using a paramedian approach.   The epidural space was localized using loss of resistance along with lateral and bi-planar fluoroscopic views.  After negative aspirate for air, blood, and CSF, a 2 ml. volume of Isovue-250 was injected into the epidural space and the flow of contrast was observed. Radiographs were obtained for documentation purposes.    The injectate was administered into the level noted above.   Additional Comments:  The patient tolerated the procedure well Dressing: Band-Aid    Post-procedure details: Patient was observed during the procedure. Post-procedure instructions were reviewed.  Patient left the clinic in stable condition.

## 2018-05-04 ENCOUNTER — Ambulatory Visit: Payer: PPO

## 2018-05-25 ENCOUNTER — Other Ambulatory Visit: Payer: Self-pay | Admitting: Family Medicine

## 2018-05-25 DIAGNOSIS — E2839 Other primary ovarian failure: Secondary | ICD-10-CM

## 2018-07-13 ENCOUNTER — Ambulatory Visit (INDEPENDENT_AMBULATORY_CARE_PROVIDER_SITE_OTHER): Payer: Self-pay

## 2018-07-13 ENCOUNTER — Ambulatory Visit (INDEPENDENT_AMBULATORY_CARE_PROVIDER_SITE_OTHER): Payer: PPO

## 2018-07-13 ENCOUNTER — Ambulatory Visit (INDEPENDENT_AMBULATORY_CARE_PROVIDER_SITE_OTHER): Payer: PPO | Admitting: Orthopaedic Surgery

## 2018-07-13 DIAGNOSIS — M25562 Pain in left knee: Secondary | ICD-10-CM | POA: Diagnosis not present

## 2018-07-13 DIAGNOSIS — M1712 Unilateral primary osteoarthritis, left knee: Secondary | ICD-10-CM

## 2018-07-13 DIAGNOSIS — M25561 Pain in right knee: Secondary | ICD-10-CM

## 2018-07-13 DIAGNOSIS — G8929 Other chronic pain: Secondary | ICD-10-CM

## 2018-07-13 DIAGNOSIS — M1711 Unilateral primary osteoarthritis, right knee: Secondary | ICD-10-CM

## 2018-07-13 MED ORDER — LIDOCAINE HCL 1 % IJ SOLN
3.0000 mL | INTRAMUSCULAR | Status: AC | PRN
  Administered 2018-07-13: 3 mL

## 2018-07-13 MED ORDER — METHYLPREDNISOLONE ACETATE 40 MG/ML IJ SUSP
40.0000 mg | INTRAMUSCULAR | Status: AC | PRN
  Administered 2018-07-13: 40 mg via INTRA_ARTICULAR

## 2018-07-13 NOTE — Progress Notes (Signed)
Office Visit Note   Patient: Kara [name]           Date of Birth: 11/12/1950           MRN: [id number] Visit Date: 07/13/2018              Requested by: Fanny Bien, [address] [place], [place] [address] PCP: Fanny Bien, MD   Assessment & Plan: Visit Diagnoses:  1. Chronic pain of left knee   2. Chronic pain of right knee   3. Unilateral primary osteoarthritis, left knee   4. Unilateral primary osteoarthritis, right knee     Plan: We are going to start conservative first with a steroid injection in both knees and follow this up a month later with hyaluronic acid.  I do feel this will help her.  I would certainly MRI the left knee if it becomes symptomatic to her more so given the fact that her x-rays look so good and she may end up having a meniscal tear giving the fact that the knee feels unstable to her and feels like it may give out.  I would like to reevaluate her in 4 weeks from now to hopefully place hyaluronic acid in her knees and determine whether or not an MRI is warranted on the left knee.  All question concerns were answered and addressed.  Follow-Up Instructions: Return in about 4 weeks (around 08/10/2018).   Orders:  Orders Placed This Encounter  Procedures  . Large Joint Inj  . Large Joint Inj  . XR Knee 1-2 Views Left  . XR Knee 1-2 Views Right   No orders of the defined types were placed in this encounter.     Procedures: Large Joint Inj: R knee on 07/13/2018 5:21 PM Indications: diagnostic evaluation and pain Details: 22 G 1.5 in needle, superolateral approach  Arthrogram: No  Medications: 3 mL lidocaine 1 %; 40 mg methylPREDNISolone acetate 40 MG/ML Outcome: tolerated well, no immediate complications Procedure, treatment alternatives, risks and benefits explained, specific risks discussed. Consent was given by the patient. Immediately prior to procedure a time out was called to verify the correct patient, procedure,  equipment, support staff and site/side marked as required. Patient was prepped and draped in the usual sterile fashion.   Large Joint Inj: L knee on 07/13/2018 5:22 PM Indications: diagnostic evaluation and pain Details: 22 G 1.5 in needle, superolateral approach  Arthrogram: No  Medications: 3 mL lidocaine 1 %; 40 mg methylPREDNISolone acetate 40 MG/ML Outcome: tolerated well, no immediate complications Procedure, treatment alternatives, risks and benefits explained, specific risks discussed. Consent was given by the patient. Immediately prior to procedure a time out was called to verify the correct patient, procedure, equipment, support staff and site/side marked as required. Patient was prepped and draped in the usual sterile fashion.       Clinical Data: No additional findings.   Subjective: Chief Complaint  Patient presents with  . Left Knee - Pain  . Right Knee - Pain  Patient comes in today for evaluation treatment of bilateral knee pain with the left much worse than the right.  This is been slowly worsening for 5 years now.  She is had injections in her knee in the past and that helped quite a bit and this was done by her primary care physician and it was with a steroid.  She said the knee does give out a lot and long distance walking because a  lot of pain in her knees.  She was on the beach the other week and stepped in understanding caused her significant amount of pain in her left knee.  She denies any locking catching.  She points to the medial aspect of her left knee as well as a lateral source of her pain on the left side.  She is not a diabetic and is very active 68 year old female.  [place].  HPI  Review of Systems She currently denies any headache, chest pain, shortness of breath, fever, chills, nausea, vomiting.  She is never had knee surgery before.  Objective: Vital Signs: There were no  vitals taken for this visit.  Physical Exam She is alert and orient x3 and in no acute distress Ortho Exam Examination of both knees shows no effusion.  The left knee has significant pain along the lateral collateral ligament and IT band but the knee is not ligamentously unstable.  She has medial joint line tenderness as well and some slight patellofemoral crepitation.  Both knees are ligamentously stable and both knees have full range of motion but are painful. Specialty Comments:  No specialty comments available.  Imaging: Xr Knee 1-2 Views Left  Result Date: 07/13/2018 2 views of the left knee show no acute findings.  There is mild patellofemoral arthritic changes.  The medial lateral joint spaces are well-maintained and the alignment is neutral.  Xr Knee 1-2 Views Right  Result Date: 07/13/2018 2 views of the right knee show no acute findings.  There is mild patellofemoral arthritic changes.  The alignment is overall neutral.  The medial and lateral compartments are well-maintained.    PMFS History: Patient Active Problem List   Diagnosis Date Noted  . Post laminectomy syndrome 02/18/2018  . Radiculopathy due to lumbar intervertebral disc disorder 02/18/2018  . Spinal stenosis of lumbar region with neurogenic claudication 02/18/2018  . Anxiety state, unspecified 09/04/2011   Past Medical History:  Diagnosis Date  . Depressive disorder, not elsewhere classified    situational (divorce, work stress)  . Herpes simplex labialis   . Osteopenia   . Seasonal allergies     Family History  Problem Relation Age of Onset  . Heart disease Mother        diagnosed in her 79's.  s/p CABG  . Cancer Mother 62       breast cancer  . Breast cancer Mother   . Alcohol abuse Father   . Diabetes Father        refused to be treated  . COPD Sister   . Diabetes Sister   . Diabetes Brother   . Diabetes Brother   . Alcohol abuse Brother        stopped when diagnosed with DM  . Cerebral  aneurysm Maternal Aunt   . Heart disease Maternal Uncle   . Cancer Maternal Grandmother        lymphoma  . Heart disease Maternal Grandfather     Past Surgical History:  Procedure Laterality Date  . BILATERAL SALPINGOOPHORECTOMY  2004   done with hysterectomy  . BUNIONECTOMY  2007   and hammertoe surgery L  . BUNIONECTOMY     R in [place], [place]  . CESAREAN SECTION  x2   [place]  . LAPAROSCOPIC ASSISTED VAGINAL HYSTERECTOMY  2004  . LUMBAR DISC SURGERY     Social History   Occupational History  .  Occupation: International aid/development worker (for physicians)    Employer: [employer]  Tobacco Use  . Smoking status: Never Smoker  . Smokeless tobacco: Never Used  Substance and Sexual Activity  . Alcohol use: Yes    Comment: 2 glasses of wine per evening.  . Drug use: No  . Sexual activity: Not on file

## 2018-07-14 ENCOUNTER — Telehealth (INDEPENDENT_AMBULATORY_CARE_PROVIDER_SITE_OTHER): Payer: Self-pay

## 2018-07-14 ENCOUNTER — Other Ambulatory Visit (INDEPENDENT_AMBULATORY_CARE_PROVIDER_SITE_OTHER): Payer: Self-pay

## 2018-07-14 NOTE — Telephone Encounter (Signed)
Bilateral knee gel injections 

## 2018-07-16 ENCOUNTER — Telehealth (INDEPENDENT_AMBULATORY_CARE_PROVIDER_SITE_OTHER): Payer: Self-pay

## 2018-07-16 ENCOUNTER — Ambulatory Visit
Admission: RE | Admit: 2018-07-16 | Discharge: 2018-07-16 | Disposition: A | Discharge status: Home / Self Care | Payer: PPO | Source: Ambulatory Visit | Attending: Family Medicine | Admitting: Family Medicine

## 2018-07-16 ENCOUNTER — Telehealth (INDEPENDENT_AMBULATORY_CARE_PROVIDER_SITE_OTHER): Payer: Self-pay | Admitting: Orthopaedic Surgery

## 2018-07-16 DIAGNOSIS — Z1231 Encounter for screening mammogram for malignant neoplasm of breast: Secondary | ICD-10-CM | POA: Diagnosis not present

## 2018-07-16 DIAGNOSIS — E2839 Other primary ovarian failure: Secondary | ICD-10-CM

## 2018-07-16 DIAGNOSIS — M85851 Other specified disorders of bone density and structure, right thigh: Secondary | ICD-10-CM | POA: Diagnosis not present

## 2018-07-16 DIAGNOSIS — Z780 Asymptomatic menopausal state: Secondary | ICD-10-CM | POA: Diagnosis not present

## 2018-07-16 NOTE — Telephone Encounter (Signed)
Noted  

## 2018-07-16 NOTE — Telephone Encounter (Signed)
Submitted VOB for Monovisc, bilateral knee. 

## 2018-07-16 NOTE — Telephone Encounter (Signed)
Kara [name] with [employer] program called asked for a call back. The number to contact Kara [name] is [phone] EXT: [phone]

## 2018-07-16 NOTE — Telephone Encounter (Signed)
Talked with a representative at [employer] and provided her with the crorect insurance for patient.

## 2018-07-17 ENCOUNTER — Ambulatory Visit (INDEPENDENT_AMBULATORY_CARE_PROVIDER_SITE_OTHER): Payer: PPO

## 2018-07-17 ENCOUNTER — Encounter: Payer: Self-pay | Admitting: Podiatry

## 2018-07-17 ENCOUNTER — Ambulatory Visit: Payer: PPO | Admitting: Podiatry

## 2018-07-17 VITALS — BP 117/72 | HR 95 | Resp 16

## 2018-07-17 DIAGNOSIS — M2011 Hallux valgus (acquired), right foot: Secondary | ICD-10-CM

## 2018-07-17 DIAGNOSIS — M2012 Hallux valgus (acquired), left foot: Secondary | ICD-10-CM

## 2018-07-17 NOTE — Progress Notes (Signed)
   Subjective:    Patient ID: Kara [name], female    DOB: 02-Dec-1950, 68 y.o.   MRN: [id number]  HPI    Review of Systems  All other systems reviewed and are negative.      Objective:   Physical Exam        Assessment & Plan:

## 2018-07-17 NOTE — Patient Instructions (Signed)
Pre-Operative Instructions  Congratulations, you have decided to take an important step towards improving your quality of life.  You can be assured that the doctors and staff at [facility] will be with you every step of the way.  Here are some important things you should know:  1. Plan to be at the surgery center/hospital at least 1 (one) hour prior to your scheduled time, unless otherwise directed by the surgical center/hospital staff.  You must have a responsible adult accompany you, remain during the surgery and drive you home.  Make sure you have directions to the surgical center/hospital to ensure you arrive on time. 2. If you are having surgery at [facility] or [facility], you will need a copy of your medical history and physical form from your family physician within one month prior to the date of surgery. We will give you a form for your primary physician to complete.  3. We make every effort to accommodate the date you request for surgery.  However, there are times where surgery dates or times have to be moved.  We will contact you as soon as possible if a change in schedule is required.   4. No aspirin/ibuprofen for one week before surgery.  If you are on aspirin, any non-steroidal anti-inflammatory medications (Mobic, Aleve, Ibuprofen) should not be taken seven (7) days prior to your surgery.  You make take Tylenol for pain prior to surgery.  5. Medications - If you are taking daily heart and blood pressure medications, seizure, reflux, allergy, asthma, anxiety, pain or diabetes medications, make sure you notify the surgery center/hospital before the day of surgery so they can tell you which medications you should take or avoid the day of surgery. 6. No food or drink after midnight the night before surgery unless directed otherwise by surgical center/hospital staff. 7. No alcoholic beverages 24-hours prior to surgery.  No smoking 24-hours prior or 24-hours after  surgery. 8. Wear loose pants or shorts. They should be loose enough to fit over bandages, boots, and casts. 9. Don't wear slip-on shoes. Sneakers are preferred. 10. Bring your boot with you to the surgery center/hospital.  Also bring crutches or a walker if your physician has prescribed it for you.  If you do not have this equipment, it will be provided for you after surgery. 11. If you have not been contacted by the surgery center/hospital by the day before your surgery, call to confirm the date and time of your surgery. 12. Leave-time from work may vary depending on the type of surgery you have.  Appropriate arrangements should be made prior to surgery with your employer. 13. Prescriptions will be provided immediately following surgery by your doctor.  Fill these as soon as possible after surgery and take the medication as directed. Pain medications will not be refilled on weekends and must be approved by the doctor. 14. Remove nail polish on the operative foot and avoid getting pedicures prior to surgery. 15. Wash the night before surgery.  The night before surgery wash the foot and leg well with water and the antibacterial soap provided. Be sure to pay special attention to beneath the toenails and in between the toes.  Wash for at least three (3) minutes. Rinse thoroughly with water and dry well with a towel.  Perform this wash unless told not to do so by your physician.  Enclosed: 1 Ice pack (please put in freezer the night before surgery)   1 Hibiclens skin cleaner     Pre-op instructions  If you have any questions regarding the instructions, please do not hesitate to call our office.  [place]: [address], [place], [place] [address] -- [phone]  [place]: [address] [place], [place] [address] -- [phone]  [place]: [address]  [place], [place] [address] -- [phone]  [place]: [address], [address], [place], [place] [address] -- [phone]  Website:  [email or web address] 

## 2018-07-19 NOTE — Progress Notes (Signed)
Subjective:   Patient ID: Kara [name], female   DOB: 68 y.o.   MRN: [id number]   HPI Patient presents stating she stepped on something and she has a little area of irritation also this bunion on her left foot is becoming more bothersome and she is developing shooting pains and she is tried wearing wider shoes and she is tried other modalities without relief of symptoms.  Patient states that she did have previous surgery years ago and has been giving her problems over the last year or 2 patient does not smoke and likes to be active   Review of Systems  All other systems reviewed and are negative.       Objective:  Physical Exam Vitals signs and nursing note reviewed.  Constitutional:      Appearance: She is well-developed.  Pulmonary:     Effort: Pulmonary effort is normal.  Musculoskeletal: Normal range of motion.  Skin:    General: Skin is warm.  Neurological:     Mental Status: She is alert.     Neurovascular status intact with muscle strength adequate range of motion within normal limits with patient noted to have prominence of the medial dorsal aspect of the first metatarsal left with pain and is noted to have a small area of irritation underneath the left third digit localized with no indications of drainage or redness or other pathology.  Patient has good digital perfusion well oriented x3     Assessment:  Moderate structural bunion deformity left medial dorsal evidence with a small area of probable foreign body left secondary to injury     Plan:  H&P discussed conditions and she would like to have this bone shaved to try to take pressure off and I recommended a modified McBride type bunionectomy and reviewed the procedure and allowed her to read consent form going over alternative treatments complications.  I then went ahead and reviewed the total recovery will take 3 to 6 months and she wants surgery signed consent form was given all preoperative instructions and  is scheduled for surgery and I did debride the lesion plantar left with no indications of pathology noted with it  X-ray indicates that there is moderate dorsal medial eminence of the first metatarsal and a small irritation plantar

## 2018-07-20 ENCOUNTER — Telehealth: Payer: Self-pay | Admitting: *Deleted

## 2018-07-20 NOTE — Telephone Encounter (Signed)
"  I in this morning to see Dr. Paulla Dolly.  I'm calling you back to let you know the Tuesday, April 21 is the best day for my surgery to be scheduled.  You can call me at [phone]."  "I left you a message this morning, after I had been at your office to see Dr. Paulla Dolly.  I wanted to schedule my surgery for April 21.  Please call me so I can get this on my calendar."

## 2018-07-22 ENCOUNTER — Telehealth (INDEPENDENT_AMBULATORY_CARE_PROVIDER_SITE_OTHER): Payer: Self-pay | Admitting: Radiology

## 2018-07-22 DIAGNOSIS — E559 Vitamin D deficiency, unspecified: Secondary | ICD-10-CM | POA: Diagnosis not present

## 2018-07-22 NOTE — Telephone Encounter (Signed)
"  I left a couple of messages last week and no one has called me back.  I'd like to schedule my surgery with Dr. Paulla Dolly."  My apologies, I am sorry I haven't returned your call.  I'm running behind.  I did get your messages.  Do you still want to schedule your surgery for April 21?  "Yes, I do."  I'll get it scheduled.  Someone from the surgical center will call you the Friday or Monday prior to your surgery date and they will give you your arrival time.

## 2018-07-23 NOTE — Telephone Encounter (Signed)
error 

## 2018-07-31 DIAGNOSIS — J069 Acute upper respiratory infection, unspecified: Secondary | ICD-10-CM | POA: Diagnosis not present

## 2018-07-31 DIAGNOSIS — J309 Allergic rhinitis, unspecified: Secondary | ICD-10-CM | POA: Diagnosis not present

## 2018-07-31 DIAGNOSIS — Z719 Counseling, unspecified: Secondary | ICD-10-CM | POA: Diagnosis not present

## 2018-08-07 DIAGNOSIS — M8580 Other specified disorders of bone density and structure, unspecified site: Secondary | ICD-10-CM | POA: Diagnosis not present

## 2018-08-07 DIAGNOSIS — E559 Vitamin D deficiency, unspecified: Secondary | ICD-10-CM | POA: Diagnosis not present

## 2018-08-07 DIAGNOSIS — I10 Essential (primary) hypertension: Secondary | ICD-10-CM | POA: Diagnosis not present

## 2018-08-07 DIAGNOSIS — J309 Allergic rhinitis, unspecified: Secondary | ICD-10-CM | POA: Diagnosis not present

## 2018-08-07 NOTE — Progress Notes (Signed)
Kara [name] - 68 y.o. female MRN [id number]  Date of birth: 07-06-1950  Office Visit Note: Visit Date: 02/18/2018 PCP: Fanny Bien, MD Referred by: Fanny Bien, MD  Subjective: Chief Complaint  Patient presents with   Lower Back - Pain   Left Leg - Pain   Left Thigh - Pain   Left Foot - Tingling   HPI: Kara [name] is a 68 y.o. female who comes in today For reevaluation management and MRI review.  By way of history she has multiple spine and orthopedic complaints but mainly chronic low back pain with some intermittent radiating symptoms to the right and sometimes to the left.  More recently she is really had more symptoms to the left.  Today she complains of low back pain referring into the left buttock and lateral thigh with some muscle spasms particularly in the anterior lateral left thigh.  She reports the spasms are worse when she tries to relax and if she is walking and doing things she does not notice it as much.  She still gets some tingling in the left foot more of an L5 dermatome.  She reports constant pain in general but he gets worse if she sits in a soft chair in certain positions.  She does report getting better with stretching and yoga.  She does try to stay physically active.  Again reports his pain is a constant burning stabbing aching pain worse with sitting and activities better with therapy and exercise.  She does not really like to take medications been intolerant of a lot of medicines in the past.  This is included codeine and pregabalin or Lyrica.  Last injection was a right L3 and left L5 transforaminal injection that did seem to help for quite a while.  We wanted to update her MRI to see if this fit or there was anything to look at from a surgical standpoint or any other interventions.  She has not had any focal weakness or foot drop.  She is had no bowel or bladder changes no trauma.  She has had a prior lumbar surgery which was  laminotomy and discectomy.  This was at L4-5.  Review of Systems  Constitutional: Negative for chills, fever, malaise/fatigue and weight loss.  HENT: Negative for hearing loss and sinus pain.   Eyes: Negative for blurred vision, double vision and photophobia.  Respiratory: Negative for cough and shortness of breath.   Cardiovascular: Negative for chest pain, palpitations and leg swelling.  Gastrointestinal: Negative for abdominal pain, nausea and vomiting.  Genitourinary: Negative for flank pain.  Musculoskeletal: Positive for back pain and joint pain. Negative for myalgias.       Left hip and leg pain with spasm  Skin: Negative for itching and rash.  Neurological: Positive for tingling. Negative for tremors, focal weakness and weakness.  Endo/Heme/Allergies: Negative.   Psychiatric/Behavioral: Negative for depression.  All other systems reviewed and are negative.  Otherwise per HPI.  Assessment & Plan: Visit Diagnoses:  1. Post laminectomy syndrome   2. Radiculopathy due to lumbar intervertebral disc disorder   3. Spinal stenosis of lumbar region with neurogenic claudication   4. Scoliosis of lumbar spine, unspecified scoliosis type     Plan: Findings:  Continued chronic low back pain with some radicular component mainly to the left now but can be on the right at times.  She does get some spasming.  New MRI was performed and shown with the patient today with spine  models and imaging.  It shows moderate multifactorial stenosis at L3-4.  There is some degenerative disc height loss at L4-5 and there is arthritis and foraminal collapse at L5-S1 on the left.  I think the tingling and leg symptoms she gets lower down her to the L5 foramen.  She might do well with a foraminotomy but I do not know from a surgical standpoint they would have to look at fusion.  She also has this thigh pain with spasming.  It is interesting or worse with resting and sitting but this seems to be after she has been  really active.  I think it is more of a neurogenic claudication type symptoms from the narrowing at L3-4.  Repeating L3 transforaminal and L5 transforaminal injection is definitely the next step.  We should discuss at some point spinal cord stimulator trial as I think she be an excellent candidate for that.  She is 36 and still very active.  Could consider regrouping with spine surgery however to look at what they could do as well.  She has some level of scoliotic deformity and I think it would require probably a bigger surgery than she wants to undertake.    Meds & Orders: No orders of the defined types were placed in this encounter.  No orders of the defined types were placed in this encounter.   Follow-up: Return for Left L3 transforaminal epidural.   Procedures: No procedures performed  No notes on file   Clinical History: MRI LUMBAR SPINE WITHOUT CONTRAST  TECHNIQUE: Multiplanar, multisequence MR imaging of the lumbar spine was performed. No intravenous contrast was administered.  COMPARISON:  09/05/2016  FINDINGS: Segmentation: 5 lumbar type vertebral bodies when compared to prior radiography.  Alignment:  Levoscoliosis and mild L4-5 anterolisthesis.  Vertebrae: Marrow edema about the right aspect of the L4-5 disc space, considered degenerative and a change from 2018. No fracture, discitis, or aggressive bone lesion  Conus medullaris and cauda equina: Conus extends to the L1-2 level. Conus and cauda equina appear normal.  Paraspinal and other soft tissues: Negative  Disc levels:  T12- L1: Tiny central protrusion.  L1-L2: Small central protrusion. Minor facet spurring. No impingement  L2-L3: Spondylosis with disc narrowing and bulging. Patent canal and foramina  L3-L4: Disc narrowing and bulging with endplate ridging. Mild posterior element hypertrophy. Moderate spinal stenosis with asymmetric right subarticular recess narrowing. Moderate  right foraminal narrowing. Right foraminal protrusion that is likely subsided from prior, but does touch the right L3 nerve root without deformity.  L4-L5: Spondylosis and endplate degeneration with right preferential bulging. There is spinal stenosis without neural compression status post laminotomy. Right foraminal stenosis with L4 root distortion.  L5-S1:Asymmetric leftward disc narrowing far-lateral spurring. Mild facet spurring. Left foraminal impingement with L5 root distortion due to disc height loss and spur.  IMPRESSION: 1. Multilevel disc and facet degeneration with levoscoliosis. Degenerative endplate edema is newly seen at L4-5, new from 2018. 2. L5-S1 chronic left foraminal compression. 3. L3-4 and L4-5 moderate right foraminal impingement. 4. L3-4 moderate spinal stenosis. There is asymmetric right subarticular recess narrowing that could affect the L4 nerve root. 5. L4-5 laminotomy with patent canal.   Electronically Signed   By: Monte Fantasia M.D.   On: 02/09/2018 11:29   She reports that she has never smoked. She has never used smokeless tobacco. No results for input(s): HGBA1C, LABURIC in the last 8760 hours.  Objective:  VS:  HT:5\' 3"  (160 cm)    WT:158 lb (  71.7 kg)   BMI:28     BP:(!) 154/88   HR:81bpm   TEMP: ( )   RESP:100 % Physical Exam Vitals signs and nursing note reviewed.  Constitutional:      General: She is not in acute distress.    Appearance: Normal appearance. She is well-developed.  HENT:     Head: Normocephalic and atraumatic.     Nose: Nose normal.     Mouth/Throat:     Mouth: Mucous membranes are moist.     Pharynx: Oropharynx is clear.  Eyes:     Conjunctiva/sclera: Conjunctivae normal.     Pupils: Pupils are equal, round, and reactive to light.  Neck:     Musculoskeletal: Normal range of motion and neck supple.  Cardiovascular:     Rate and Rhythm: Regular rhythm.  Pulmonary:     Effort: Pulmonary effort is normal. No  respiratory distress.  Abdominal:     General: There is no distension.     Palpations: Abdomen is soft.     Tenderness: There is no guarding.  Musculoskeletal:     Right lower leg: No edema.     Left lower leg: No edema.     Comments: Patient ambulates without aid.  She has somewhat of a forward flexed lumbar spine.  She has pain with extension of the lumbar spine and facet loading.  She has no pain with hip rotation.  She is tender really all across the lower back and greater trochanters and PSIS.  Almost a tender point type situation.  She has good distal strength without clonus.  Some very very minor weakness of left dorsiflexion compared to right.  She does have some subjective decreased sensation in L5 dermatome.  Skin:    General: Skin is warm and dry.     Findings: No erythema or rash.  Neurological:     General: No focal deficit present.     Mental Status: She is alert and oriented to person, place, and time.     Motor: No abnormal muscle tone.     Coordination: Coordination normal.     Gait: Gait normal.  Psychiatric:        Mood and Affect: Mood normal.        Behavior: Behavior normal.        Thought Content: Thought content normal.     Ortho Exam Imaging: No results found.  Past Medical/Family/Surgical/Social History: Medications & Allergies reviewed per EMR, new medications updated. Patient Active Problem List   Diagnosis Date Noted   Post laminectomy syndrome 02/18/2018   Radiculopathy due to lumbar intervertebral disc disorder 02/18/2018   Spinal stenosis of lumbar region with neurogenic claudication 02/18/2018   Anxiety state, unspecified 09/04/2011   Mood disorder in conditions classified elsewhere 10/05/2009   Disequilibrium 08/24/2009   Elevated blood-pressure reading without diagnosis of hypertension 08/24/2009   Vitamin D deficiency 08/24/2009   Contact dermatitis and other eczema due to plants (except food) 07/28/2009   DDD (degenerative disc  disease), lumbosacral 12/08/2007   Eustachian tube dysfunction 12/08/2007   Otalgia 12/08/2007   Past Medical History:  Diagnosis Date   Depressive disorder, not elsewhere classified    situational (divorce, work stress)   Herpes simplex labialis    Osteopenia    Seasonal allergies    Family History  Problem Relation Age of Onset   Heart disease Mother        diagnosed in her 53's.  s/p CABG   Cancer Mother 71  breast cancer   Breast cancer Mother    Alcohol abuse Father    Diabetes Father        refused to be treated   COPD Sister    Diabetes Sister    Diabetes Brother    Diabetes Brother    Alcohol abuse Brother        stopped when diagnosed with DM   Cerebral aneurysm Maternal Aunt    Heart disease Maternal Uncle    Cancer Maternal Grandmother        lymphoma   Heart disease Maternal Grandfather    Past Surgical History:  Procedure Laterality Date   BILATERAL SALPINGOOPHORECTOMY  2004   done with hysterectomy   BUNIONECTOMY  2007   and hammertoe surgery L   BUNIONECTOMY     R in [place], [place]  x2   [place]  2004   LUMBAR [place]     Social History   Occupational History   Occupation: International aid/development worker (for physicians)    Employer: [employer]  Tobacco Use   Smoking status: Never Smoker   Smokeless tobacco: Never Used  Substance and Sexual Activity   Alcohol use: Yes    Comment: 2 glasses of wine per evening.   Drug use: No   Sexual activity: Not on file

## 2018-08-10 ENCOUNTER — Ambulatory Visit (INDEPENDENT_AMBULATORY_CARE_PROVIDER_SITE_OTHER): Payer: PPO | Admitting: Orthopaedic Surgery

## 2018-08-10 ENCOUNTER — Telehealth: Payer: Self-pay | Admitting: *Deleted

## 2018-08-10 NOTE — Telephone Encounter (Signed)
I am calling to reschedule your surgery once again.  The surgical center is saying they can do it on September 08, 2018 or he can do it on 005/09/2018.  "Let's schedule it for April 28, the sooner the better."  I'll get it rescheduled.  I rescheduled the surgery date from 09/01/2018 to 09/08/2018 via the surgical center's [facility] [facility].

## 2018-08-11 ENCOUNTER — Telehealth: Payer: Self-pay | Admitting: *Deleted

## 2018-08-11 NOTE — Telephone Encounter (Signed)
I am calling to reschedule your surgery again.  I am sorry.  I know I just spoke to you last week.  "It spoke to you yesterday.  When can he do it?"  He can do it on Sep 29, 2018.  "I can't do it then.  I'm scheduled to go to [place] that day.  I'll have to do it in June.  Can he do it on June 2?"  Yes, I'll reschedule you to October 13, 2018.  "Someone will call with a time correct?"  Yes, someone from the surgical center will give you a call the Friday or Monday prior to your surgery date and they'll give you your arrival time.

## 2018-08-13 ENCOUNTER — Ambulatory Visit (INDEPENDENT_AMBULATORY_CARE_PROVIDER_SITE_OTHER): Payer: PPO | Admitting: Orthopaedic Surgery

## 2018-08-31 ENCOUNTER — Telehealth: Payer: Self-pay | Admitting: *Deleted

## 2018-08-31 NOTE — Telephone Encounter (Signed)
The surgical center has reopened.  Dr. Paulla Dolly wanted me to call you and see if you would like to move your surgery up from 10/13/2018 to 09/15/2018.  "May 5 will be fine.  What time will I need to be there?"  Someone from the surgical center will call you the Friday or Monday prior to your surgery date.  They will give you your arrival time.    I rescheduled her surgery via the surgical center's [facility] [employer] [facility].

## 2018-09-01 ENCOUNTER — Telehealth: Payer: Self-pay | Admitting: *Deleted

## 2018-09-01 NOTE — Telephone Encounter (Signed)
"  Someone called me yesterday and we rescheduled my surgery with Dr. Paulla Dolly.  I need to go over that date.  I probably need to change it.  Please call me at your earliest convenience."  I attempted to return her call.  I left her a message to call me back.

## 2018-09-02 NOTE — Telephone Encounter (Signed)
"  We rescheduled my surgery.  When did we reschedule it to?  I might have to change it."  We rescheduled it you Sep 15, 2018.  "Yes, I need to change it.  We'll be out of town during that time.  Can he do it on May 26?"  Yes, he can do it on Oct 06, 2018.  I'll reschedule it.  "Will you contact the surgical center?"  Yes, I'll let them know.  I rescheduled her surgery from 09/15/2018 to 10/06/2018 via the surgical center's [facility] [employer] [facility].

## 2018-09-07 ENCOUNTER — Ambulatory Visit (INDEPENDENT_AMBULATORY_CARE_PROVIDER_SITE_OTHER): Payer: PPO | Admitting: Orthopaedic Surgery

## 2018-09-08 ENCOUNTER — Ambulatory Visit (INDEPENDENT_AMBULATORY_CARE_PROVIDER_SITE_OTHER): Payer: PPO | Admitting: Orthopaedic Surgery

## 2018-09-16 ENCOUNTER — Telehealth: Payer: Self-pay

## 2018-09-16 ENCOUNTER — Telehealth: Payer: Self-pay | Admitting: Orthopaedic Surgery

## 2018-09-16 NOTE — Telephone Encounter (Signed)
Patient is approved for Monovisc, bilateral knee. Kara [name] Patient will be responsible for 20% of the allowable amount. No Co-pay No PA required  Appt. 09/23/2018 with Dr. Ninfa Linden

## 2018-09-16 NOTE — Telephone Encounter (Signed)
Talked with patient and scheduled appointment with Dr. Ninfa Linden on Wednesday, 09/23/2018.

## 2018-09-16 NOTE — Telephone Encounter (Signed)
Patient called needing to know if she can schedule the gel injection next week.  Patient said she will be in town next week. The number to contact patient is [phone]

## 2018-09-21 ENCOUNTER — Telehealth: Payer: Self-pay | Admitting: *Deleted

## 2018-09-21 NOTE — Telephone Encounter (Signed)
"  I'm scheduled for surgery on May 26.  I need to reschedule it to June.23, 2020."  I will reschedule it from Oct 06, 2018 to November 03, 2018.  I rescheduled her surgery from Oct 06, 2018 to November 03, 2018 via the surgical center's [facility] [facility].

## 2018-09-23 ENCOUNTER — Ambulatory Visit (INDEPENDENT_AMBULATORY_CARE_PROVIDER_SITE_OTHER): Payer: PPO | Admitting: Orthopaedic Surgery

## 2018-09-23 ENCOUNTER — Encounter: Payer: Self-pay | Admitting: Orthopaedic Surgery

## 2018-09-23 ENCOUNTER — Other Ambulatory Visit: Payer: Self-pay

## 2018-09-23 VITALS — Ht 63.0 in | Wt 159.0 lb

## 2018-09-23 DIAGNOSIS — M1712 Unilateral primary osteoarthritis, left knee: Secondary | ICD-10-CM | POA: Diagnosis not present

## 2018-09-23 DIAGNOSIS — M1711 Unilateral primary osteoarthritis, right knee: Secondary | ICD-10-CM

## 2018-09-23 MED ORDER — HYALURONAN 88 MG/4ML IX SOSY
88.0000 mg | PREFILLED_SYRINGE | INTRA_ARTICULAR | Status: AC | PRN
  Administered 2018-09-23: 88 mg via INTRA_ARTICULAR

## 2018-09-23 NOTE — Progress Notes (Signed)
   Procedure Note  Patient: Kara [name]             Date of Birth: [birth year]           MRN: [id number]             Visit Date: 09/23/2018 HPI: Ms. Lenoir comes in today for bilateral Monovisc injections.  She had cortisone injections both knees by Dr. Ninfa Linden on 07/13/2018.  She states that these were short-lived.  She is had no new injury to either knee.  She has known patellofemoral arthritic changes of both knees.  Physical exam: Bilateral knees no effusion no abnormal warmth.  Good range of motion both knees. Procedures: Visit Diagnoses: Unilateral primary osteoarthritis, left knee  Unilateral primary osteoarthritis, right knee  Large Joint Inj: bilateral knee on 09/23/2018 2:47 PM Indications: pain Details: 22 G 1.5 in needle, superolateral approach  Arthrogram: No  Medications (Right): 88 mg Hyaluronan 88 MG/4ML Medications (Left): 88 mg Hyaluronan 88 MG/4ML Outcome: tolerated well, no immediate complications Procedure, treatment alternatives, risks and benefits explained, specific risks discussed. Consent was given by the patient. Immediately prior to procedure a time out was called to verify the correct patient, procedure, equipment, support staff and site/side marked as required. Patient was prepped and draped in the usual sterile fashion.     Plan: She understands that she needs to wait at least 6 months between supplemental injections in 3 months between cortisone injections.  She will work on Forensic scientist.  Follow-up with [place] as needed

## 2018-10-21 DIAGNOSIS — Z01818 Encounter for other preprocedural examination: Secondary | ICD-10-CM | POA: Diagnosis not present

## 2018-10-21 DIAGNOSIS — Z1159 Encounter for screening for other viral diseases: Secondary | ICD-10-CM | POA: Diagnosis not present

## 2018-10-23 DIAGNOSIS — D487 Neoplasm of uncertain behavior of other specified sites: Secondary | ICD-10-CM | POA: Diagnosis not present

## 2018-10-23 DIAGNOSIS — Z885 Allergy status to narcotic agent status: Secondary | ICD-10-CM | POA: Diagnosis not present

## 2018-10-23 DIAGNOSIS — Z79899 Other long term (current) drug therapy: Secondary | ICD-10-CM | POA: Diagnosis not present

## 2018-10-23 DIAGNOSIS — D2239 Melanocytic nevi of other parts of face: Secondary | ICD-10-CM | POA: Diagnosis not present

## 2018-10-23 DIAGNOSIS — H538 Other visual disturbances: Secondary | ICD-10-CM | POA: Diagnosis not present

## 2018-10-23 DIAGNOSIS — H53453 Other localized visual field defect, bilateral: Secondary | ICD-10-CM | POA: Diagnosis not present

## 2018-10-23 DIAGNOSIS — F329 Major depressive disorder, single episode, unspecified: Secondary | ICD-10-CM | POA: Diagnosis not present

## 2018-10-23 DIAGNOSIS — I10 Essential (primary) hypertension: Secondary | ICD-10-CM | POA: Diagnosis not present

## 2018-10-23 DIAGNOSIS — H02831 Dermatochalasis of right upper eyelid: Secondary | ICD-10-CM | POA: Diagnosis not present

## 2018-10-23 DIAGNOSIS — Z791 Long term (current) use of non-steroidal anti-inflammatories (NSAID): Secondary | ICD-10-CM | POA: Diagnosis not present

## 2018-10-23 DIAGNOSIS — H02423 Myogenic ptosis of bilateral eyelids: Secondary | ICD-10-CM | POA: Diagnosis not present

## 2018-10-23 DIAGNOSIS — H02834 Dermatochalasis of left upper eyelid: Secondary | ICD-10-CM | POA: Diagnosis not present

## 2018-10-26 ENCOUNTER — Telehealth: Payer: Self-pay | Admitting: *Deleted

## 2018-10-26 IMAGING — MR MR LUMBAR SPINE WO/W CM
4 of 7 series · 15 of 48 positions shown · IV contrast (Multihance 15ml)
Comparison: Lumbar radiographs 07/04/2016. [REDACTED] lumbar MRI 10/14/2006. CT lumbar myelogram 08/24/2004.

CLINICAL DATA: 66-year-old female with left side lumbar back pain
radiating to the buttock and leg. No recent injury. Prior surgery.

Creatinine was obtained on site at [HOSPITAL] at [HOSPITAL].
Results: Creatinine 0.7 mg/dL.
EXAM:
MRI LUMBAR SPINE WITHOUT AND WITH CONTRAST
TECHNIQUE: Multiplanar and multiecho pulse sequences of the lumbar spine were
obtained without and with intravenous contrast.
CONTRAST:  15mL MULTIHANCE GADOBENATE DIMEGLUMINE 529 MG/ML IV SOLN

[Series 6: T1 · sagittal · 4.0mm · 0.78mm/px · 3 of 15 slices shown (1 of 2)]
[im 1/15]
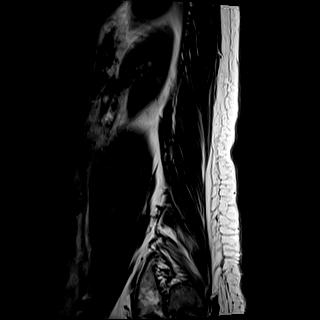
[im 10/15]
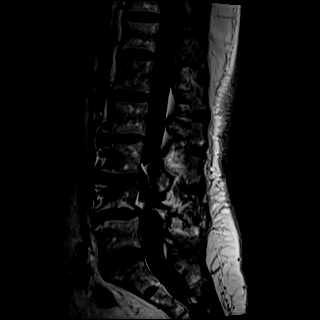
[im 15/15]
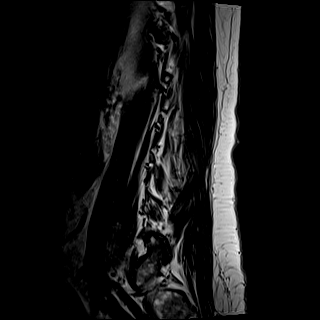

[Series 10: T1 · axial · 4.0mm · 0.28mm/px · z∈[+27,+141]mm · 3 of 30 slices shown (2 of 2)]
[im 4/30]
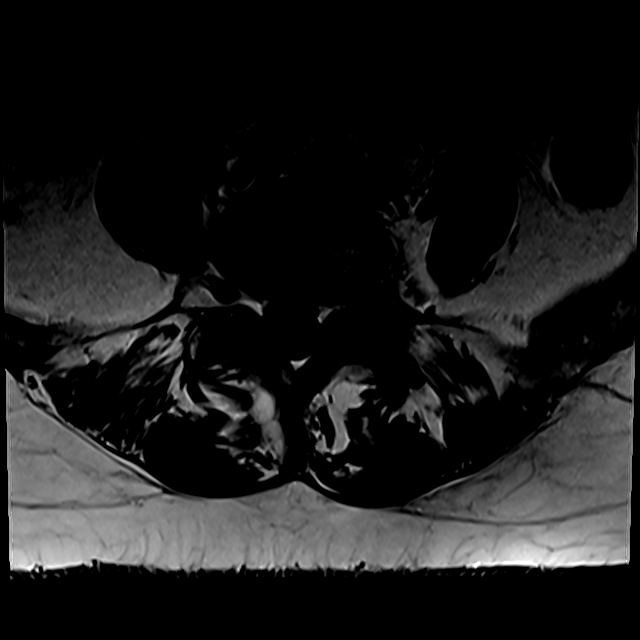
[im 17/30]
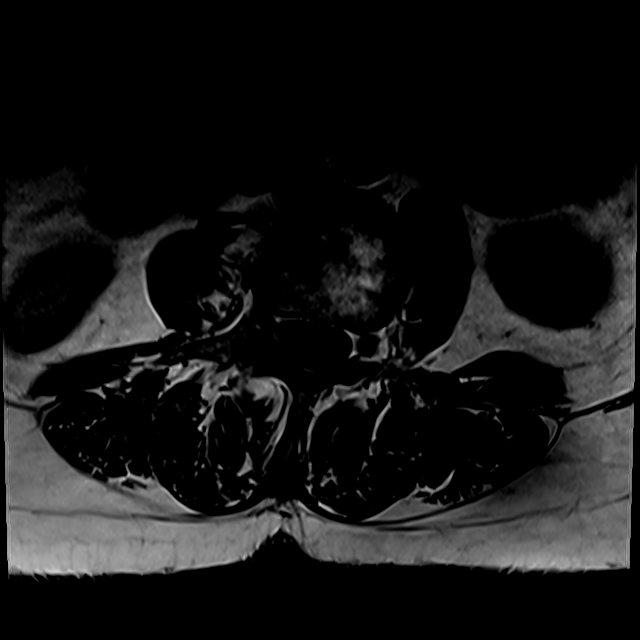
[im 26/30]
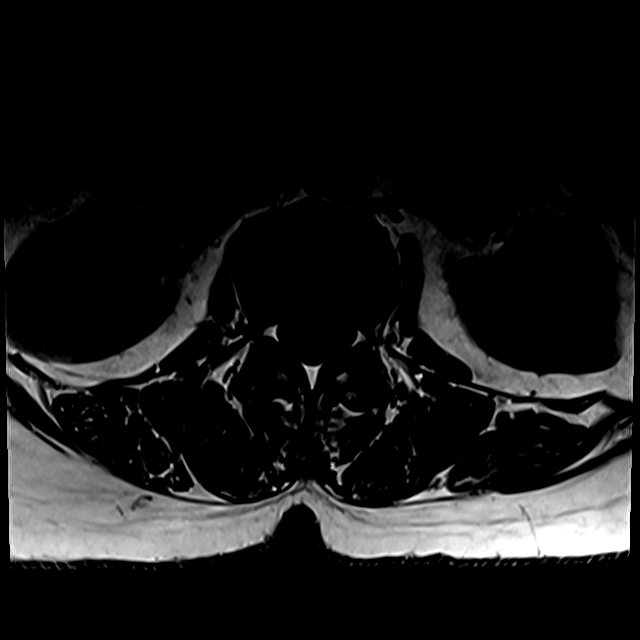

[Series 13: T2 · axial · 4.0mm · 0.28mm/px · z∈[+12,+141]mm · 6 of 30 slices shown (1 of 2)]
[im 1/30]
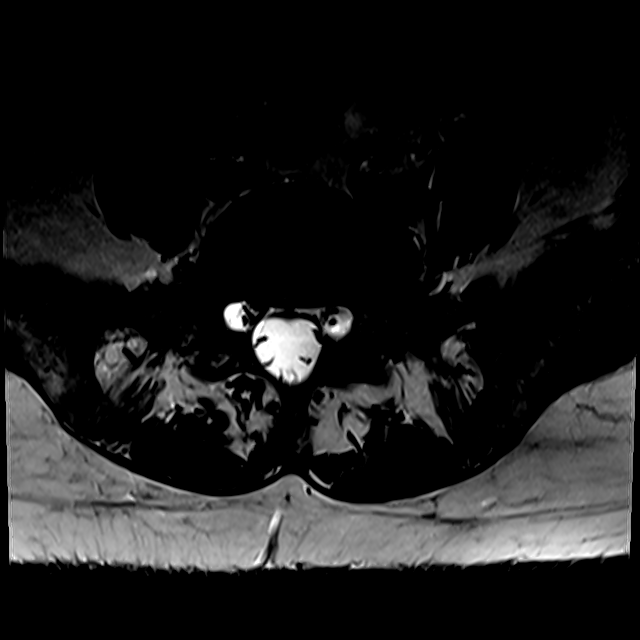
[im 4/30]
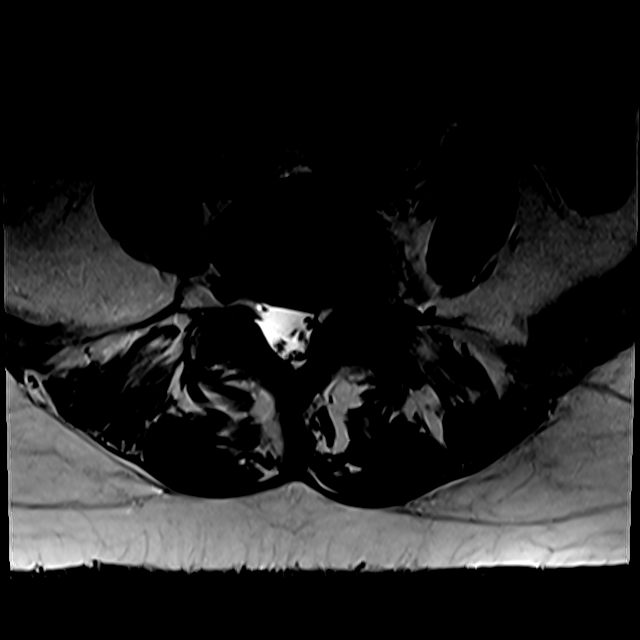
[im 10/30]
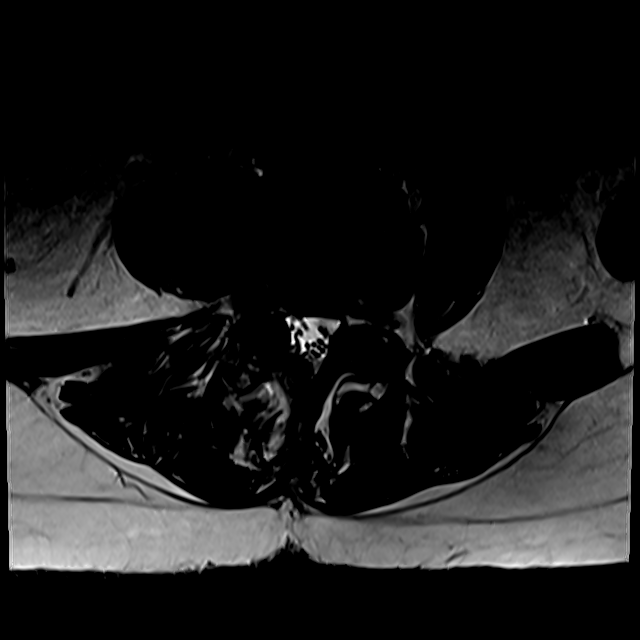
[im 13/30]
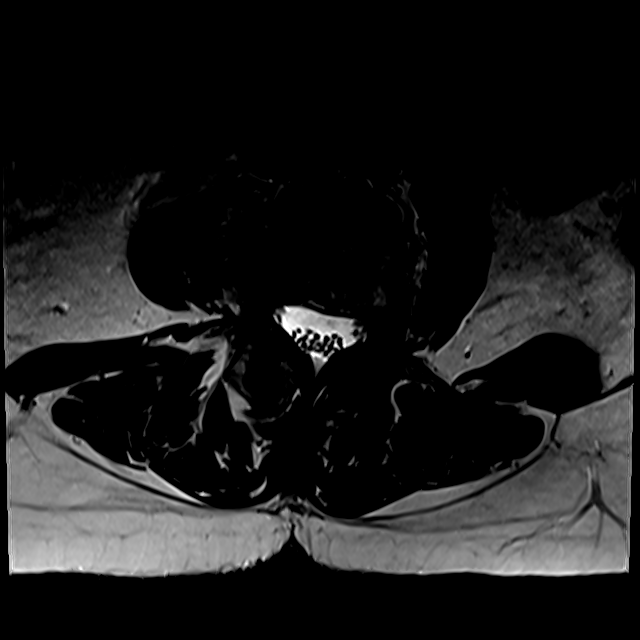
[im 17/30]
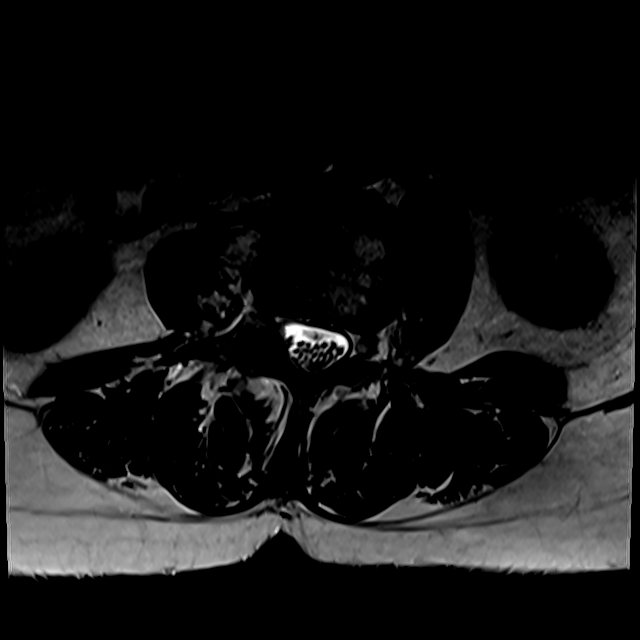
[im 26/30]
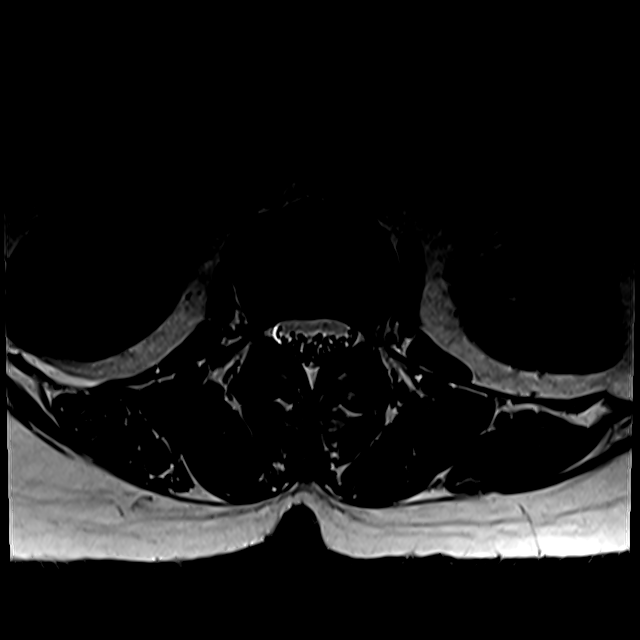

[Series 14: T2 · sagittal · 4.0mm · 0.65mm/px · 3 of 15 slices shown (2 of 2)]
[im 1/15]
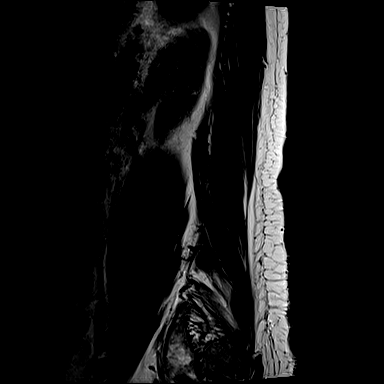
[im 8/15]
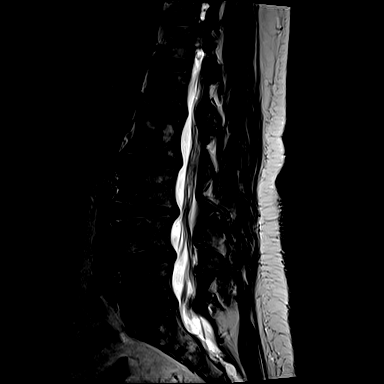
[im 15/15]
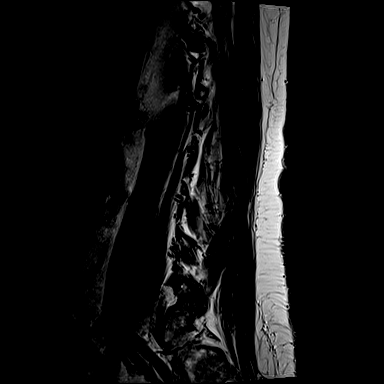

[15 of 48 positions shown; findings below may reference images not displayed]

FINDINGS: Segmentation:  Normal, as demonstrated on the recent radiographs.

Alignment: Moderate levoconvex lumbar scoliosis, apex at L3-L4, and
straightening of lumbar lordosis have mildly progressed since 1117.

Vertebrae: Rightward degenerative endplate and vertebral body marrow
edema at L4 (series 7, image 7). Otherwise bone marrow signal is
heterogeneous but within normal limits. Visible sacrum is intact.
Negative left SI joint.

Conus medullaris: Extends to the L1-L2 level and appears normal. No
abnormal intradural enhancement.

Paraspinal and other soft tissues: Negative.

Disc levels:

T11-T12: Mild facet hypertrophy.

T12-L1: Subtle central disc protrusion (series 13, image 1). Mild
left facet hypertrophy. No stenosis.

L1-L2: Small broad-based right paracentral disc protrusion (series
13, image 4). Mild to moderate facet and ligament flavum
hypertrophy. No stenosis.

L2-L3: Disc space loss since 1117. Increased circumferential disc
bulge and endplate spurring with broad-based posterior component.
Mild facet hypertrophy is not significantly changed. Increased mass
effect on the ventral thecal sac. Borderline to mild spinal stenosis
is new since 1117 but there is no convincing lateral recess or
foraminal stenosis.

L3-L4: Disc space loss with bulky circumferential but right
eccentric disc bulge and endplate spurring since 1117. Increased
moderate facet hypertrophy. New moderate to severe right lateral
recess stenosis (descending right L4 nerve level series 13, image
16). New mild to moderate spinal stenosis. New mild to moderate
right L3 foraminal stenosis. No significant left foraminal or low
lateral recess stenosis.

L4-L5: Previous postoperative changes to the right lamina. Mild
interval disc space loss. Increased right far lateral disc bulge and
endplate spurring. Relatively stable moderate residual facet
hypertrophy. Stable thecal sac patency, no spinal or lateral recess
stenosis. However, bilateral neural foraminal stenosis has developed
and is mild to moderate (left side series 14, image 12 and right
side series 14, image 4).

L5-S1: Better preserved disc space height at this level since 1117,
but progressed left far lateral disc bulging and endplate spurring.
Furthermore, there is loss of the normal fat signal from the left L5
neural foramen best seen on series 10, images 26 and 27, obscuring
the exiting left L5 nerve. Interval increased moderate bilateral
facet hypertrophy. Possible previous postoperative changes to the
right lamina at this level. Thecal sac remains patent without spinal
stenosis. There is new mild left lateral recess stenosis (descending
left S1 nerve root level series 13, image 28).
IMPRESSION: 1. Symptomatic level with regard to left side pain favored to be
L5-S1 where a left foraminal disc herniation is suspected, and
superimposed on progressed left far lateral disc and endplate
degeneration since the 1117 MRI. Query left L5 radiculitis. There is
also new mild multifactorial left lateral recess stenosis at that
level.
2. Progressed levoconvex lumbar scoliosis since 1117 along with
increased disc and endplate degeneration also at L2-L3 through
L4-L5. But increased neural impingement at those levels is primarily
right-sided. There is new mild to moderate spinal stenosis at L2-L3
and L3-L4.
3. Prior postoperative changes at L4-L5. No recurrent spinal
stenosis but new mild to moderate bilateral L4 foraminal stenosis.

## 2018-10-26 NOTE — Telephone Encounter (Signed)
"  I'm scheduled for surgery on the twenty-third with Dr. Paulla Dolly.  I think I'm going to postpone that.  I just had some other surgery, I'm still going to have sutures.  It's just going to be too much for me to do this close together."

## 2018-10-29 NOTE — Telephone Encounter (Signed)
I'm returning your call.  You want to cancel your surgery?  "Yes, I do.  I just had eyelid surgery and I want to see how this goes before I have another surgery.  Can you let Dr. Paulla Dolly know why I canceled the surgery?"  Yes, I will let Dr. Paulla Dolly know.  "I'll call back when I'm ready to reschedule."  I canceled her surgery that's scheduled for November 03, 2018 via the surgical center's [facility].

## 2018-12-04 DIAGNOSIS — I10 Essential (primary) hypertension: Secondary | ICD-10-CM | POA: Diagnosis not present

## 2018-12-04 DIAGNOSIS — F331 Major depressive disorder, recurrent, moderate: Secondary | ICD-10-CM | POA: Diagnosis not present

## 2018-12-04 DIAGNOSIS — F411 Generalized anxiety disorder: Secondary | ICD-10-CM | POA: Diagnosis not present

## 2018-12-04 DIAGNOSIS — J309 Allergic rhinitis, unspecified: Secondary | ICD-10-CM | POA: Diagnosis not present

## 2018-12-11 DIAGNOSIS — F411 Generalized anxiety disorder: Secondary | ICD-10-CM | POA: Diagnosis not present

## 2018-12-11 DIAGNOSIS — I10 Essential (primary) hypertension: Secondary | ICD-10-CM | POA: Diagnosis not present

## 2018-12-11 DIAGNOSIS — E782 Mixed hyperlipidemia: Secondary | ICD-10-CM | POA: Diagnosis not present

## 2018-12-22 DIAGNOSIS — Z20828 Contact with and (suspected) exposure to other viral communicable diseases: Secondary | ICD-10-CM | POA: Diagnosis not present

## 2018-12-22 DIAGNOSIS — Z03818 Encounter for observation for suspected exposure to other biological agents ruled out: Secondary | ICD-10-CM | POA: Diagnosis not present

## 2018-12-22 DIAGNOSIS — Z209 Contact with and (suspected) exposure to unspecified communicable disease: Secondary | ICD-10-CM | POA: Diagnosis not present

## 2019-01-25 ENCOUNTER — Telehealth: Payer: Self-pay | Admitting: Radiology

## 2019-01-25 NOTE — Telephone Encounter (Signed)
I called and sched for Trigger point injections and discuss low back pain on 02/09/2019 @ 9am

## 2019-01-25 NOTE — Telephone Encounter (Signed)
We can do trigger points and talk about her back at the same time

## 2019-01-25 NOTE — Telephone Encounter (Signed)
Kara [name] is calling states that she would like to get a couple of trigger point injections done but she is also having low back pain that she has been having for 6 weeks now.   Is this ok to sched for consult?

## 2019-02-09 ENCOUNTER — Ambulatory Visit (INDEPENDENT_AMBULATORY_CARE_PROVIDER_SITE_OTHER): Payer: PPO | Admitting: Physical Medicine and Rehabilitation

## 2019-02-09 ENCOUNTER — Encounter: Payer: Self-pay | Admitting: Physical Medicine and Rehabilitation

## 2019-02-09 DIAGNOSIS — M25511 Pain in right shoulder: Secondary | ICD-10-CM

## 2019-02-09 DIAGNOSIS — M48062 Spinal stenosis, lumbar region with neurogenic claudication: Secondary | ICD-10-CM | POA: Diagnosis not present

## 2019-02-09 DIAGNOSIS — M419 Scoliosis, unspecified: Secondary | ICD-10-CM

## 2019-02-09 DIAGNOSIS — M961 Postlaminectomy syndrome, not elsewhere classified: Secondary | ICD-10-CM | POA: Diagnosis not present

## 2019-02-09 DIAGNOSIS — E782 Mixed hyperlipidemia: Secondary | ICD-10-CM | POA: Diagnosis not present

## 2019-02-09 DIAGNOSIS — E559 Vitamin D deficiency, unspecified: Secondary | ICD-10-CM | POA: Diagnosis not present

## 2019-02-09 DIAGNOSIS — G8929 Other chronic pain: Secondary | ICD-10-CM | POA: Diagnosis not present

## 2019-02-09 DIAGNOSIS — G894 Chronic pain syndrome: Secondary | ICD-10-CM | POA: Diagnosis not present

## 2019-02-09 DIAGNOSIS — M25512 Pain in left shoulder: Secondary | ICD-10-CM | POA: Diagnosis not present

## 2019-02-09 DIAGNOSIS — M7918 Myalgia, other site: Secondary | ICD-10-CM | POA: Diagnosis not present

## 2019-02-09 DIAGNOSIS — I10 Essential (primary) hypertension: Secondary | ICD-10-CM | POA: Diagnosis not present

## 2019-02-09 MED ORDER — TRAMADOL HCL 50 MG PO TABS: 50.0000 mg | ORAL_TABLET | Freq: Three times a day (TID) | ORAL | 0 refills | Status: AC | PRN

## 2019-02-09 NOTE — Progress Notes (Signed)
prescription. Numeric Pain Rating Scale and Functional Assessment Average Pain 8 Pain Right Now 7 My pain is constant, sharp and dull Pain is worse with: some activites Pain improves with: rest   In the last MONTH (on 0-10 scale) has pain interfered with the following?  1. General activity like being  able to carry out your everyday physical activities such as walking, climbing stairs, carrying groceries, or moving a chair?  Rating(7)  2. Relation with others like being able to carry out your usual social activities and roles such as  activities at home, at work and in your community. Rating(7)  3. Enjoyment of life such that you have  been bothered by emotional problems such as feeling anxious, depressed or irritable?  Rating(9)

## 2019-02-09 NOTE — Progress Notes (Signed)
Kara [name] - 68 y.o. female MRN [id number]  Date of birth: August 26, 1950  Office Visit Note: Visit Date: 02/09/2019 PCP: Fanny Bien, MD Referred by: Fanny Bien, MD  Subjective: Chief Complaint  Patient presents with   Right Shoulder - Pain   Left Shoulder - Pain   Lower Back - Pain   HPI: Kara [name] is a 68 y.o. female who comes in today For reevaluation and management of chronic problems with neck and shoulder blade pain which seem to be myofascial had responded in the past to trigger point injections and continued home exercises.  She also has low back pain worse with standing worse with going from sit to stand some referral to the thighs bilaterally.  She has a history of moderate lumbar stenosis prior lower lumbar laminectomy no history of fusion.  MRI is fairly updated and reviewed again below.  Prior epidural injection has totally relieved her leg pain and what she refers to as sciatica.  She has pain going from sit to stand and really difficulty with twisting.  This is been ongoing now for several months.  She does do some home exercises that she has learned through therapy in the past but has not had recent physical therapy over the last month or 2.  She has had problems because she has really been doing a lot of moving and boxes and they have moved to [facility] at this point.  She does spend some time here.  In terms of her shoulder blades she gets pain across the upper back in the trapezius and levator scapula and rhomboids.  On the right side she gets a sharp pain sometimes that will have a burning quality to it and it is pretty superficial but she can feel it with certain movements.  She has no radicular pain in the arms or paresthesias.  Again no leg pain but back pain worse with standing and moving.  She continues to take anti-inflammatory at time but does not really like to take medications.  She has been intolerant of Lyrica and  codeine.  She has had a history of underlying depression and anxiety but this seems to be well controlled.  She has randomly taken Xanax for anxiety.  She does not take this regularly.  She talks about taking 1 of her husband's tramadol medications recently with worsening pain and felt like it did help quite a bit.  She endorses that she normally does not take any once medications but was trying something at that point at his suggestion and it did seem to help quite a bit.  She has no real history of opioid use.  Review of Systems  Constitutional: Negative for chills, fever, malaise/fatigue and weight loss.  HENT: Negative for hearing loss and sinus pain.   Eyes: Negative for blurred vision, double vision and photophobia.  Respiratory: Negative for cough and shortness of breath.   Cardiovascular: Negative for chest pain, palpitations and leg swelling.  Gastrointestinal: Negative for abdominal pain, nausea and vomiting.  Genitourinary: Negative for flank pain.  Musculoskeletal: Positive for back pain, joint pain and neck pain. Negative for myalgias.  Skin: Negative for itching and rash.  Neurological: Negative for tremors, focal weakness and weakness.  Endo/Heme/Allergies: Negative.   Psychiatric/Behavioral: Negative for depression.  All other systems reviewed and are negative.  Otherwise per HPI.  Assessment & Plan: Visit Diagnoses:  1. Chronic pain of both shoulders   2. Myofascial pain syndrome  3. Scoliosis of thoracolumbar spine, unspecified scoliosis type   4. Spinal stenosis of lumbar region with neurogenic claudication   5. Post laminectomy syndrome   6. Chronic pain syndrome     Plan: Findings:  In terms of her chronic neck and shoulder upper back pain I still feel like most of this is myofascial trigger point type pain.  She has had history of some shoulder impingement in the past but no real issues otherwise.  She has no radicular complaints.  I think at this point we will go  ahead and complete repeat trigger point injections today.  Exam shows pretty palpable and painful trigger points in the right rhomboid region as well as right levator scapula and trapezius and left levator scapula.  We discussed regrouping with a physical therapist may be in [place] that could do dry needling if we could find somebody that could do it there.  We also discussed the fact that she is going to find a primary care physician and perhaps someone that can do lumbar spine injections in [place].  In terms of her back pain I think this is facet mediated low back pain.  I would like to try diagnostic medial branch blocks.  If she gets good relief diagnostically would look at double block paradigm and ultimately radiofrequency ablation.  She will be back and forth in town at least through the end of the year.  If she does not get relief that I think this may be more myofascial pain as well although I did not palpate any trigger points today in the lower spine.  Obviously intermittently she may have flareup of sciatica which she does not have at this point.  She does have moderate narrowing of the canal with no nerve compression.  I will give her a trial of tramadol to see if it does help and tell her to use this on really rare occasions.  We discussed the pitfalls of using medication like this chronically.  She does understand that.    Meds & Orders:  Meds ordered this encounter  Medications   traMADol (ULTRAM) 50 MG tablet    Sig: Take 1 tablet (50 mg total) by mouth every 8 (eight) hours as needed for up to 5 days for moderate pain.    Dispense:  15 tablet    Refill:  0    Orders Placed This Encounter  Procedures   Trigger Point Inj    Follow-up: Return for Bilateral medial branch blocks at L3-4 L4-5.   Procedures: Trigger Point Inj  Date/Time: 02/10/2019 5:30 AM Performed by: Magnus Sinning, MD Authorized by: Magnus Sinning, MD   Consent Given by:  Patient Site marked: the  procedure site was marked   Timeout: prior to procedure the correct patient, procedure, and site was verified   Total # of Trigger Points:  3 or more Location: neck, back and shoulder   Needle Size:  25 G Approach:  Dorsal Medications #1:  20 mg triamcinolone acetonide 40 MG/ML Medications #2:  3 mL lidocaine 1 % Additional Injections?: No   Patient tolerance:  Patient tolerated the procedure well with no immediate complications Comments: Trigger points palpated and marked in the right levator scapula right trapezius and right rhomboid as well as left levator scapula.  Needling technique was utilized.    No notes on file   Clinical History: MRI LUMBAR SPINE WITHOUT CONTRAST  TECHNIQUE: Multiplanar, multisequence MR imaging of the lumbar spine was performed. No intravenous contrast was  administered.  COMPARISON:  09/05/2016  FINDINGS: Segmentation: 5 lumbar type vertebral bodies when compared to prior radiography.  Alignment:  Levoscoliosis and mild L4-5 anterolisthesis.  Vertebrae: Marrow edema about the right aspect of the L4-5 disc space, considered degenerative and a change from 2018. No fracture, discitis, or aggressive bone lesion  Conus medullaris and cauda equina: Conus extends to the L1-2 level. Conus and cauda equina appear normal.  Paraspinal and other soft tissues: Negative  Disc levels:  T12- L1: Tiny central protrusion.  L1-L2: Small central protrusion. Minor facet spurring. No impingement  L2-L3: Spondylosis with disc narrowing and bulging. Patent canal and foramina  L3-L4: Disc narrowing and bulging with endplate ridging. Mild posterior element hypertrophy. Moderate spinal stenosis with asymmetric right subarticular recess narrowing. Moderate right foraminal narrowing. Right foraminal protrusion that is likely subsided from prior, but does touch the right L3 nerve root without deformity.  L4-L5: Spondylosis and endplate degeneration  with right preferential bulging. There is spinal stenosis without neural compression status post laminotomy. Right foraminal stenosis with L4 root distortion.  L5-S1:Asymmetric leftward disc narrowing far-lateral spurring. Mild facet spurring. Left foraminal impingement with L5 root distortion due to disc height loss and spur.  IMPRESSION: 1. Multilevel disc and facet degeneration with levoscoliosis. Degenerative endplate edema is newly seen at L4-5, new from 2018. 2. L5-S1 chronic left foraminal compression. 3. L3-4 and L4-5 moderate right foraminal impingement. 4. L3-4 moderate spinal stenosis. There is asymmetric right subarticular recess narrowing that could affect the L4 nerve root. 5. L4-5 laminotomy with patent canal.   Electronically Signed   By: Monte Fantasia M.D.   On: 02/09/2018 11:29   She reports that she has never smoked. She has never used smokeless tobacco. No results for input(s): HGBA1C, LABURIC in the last 8760 hours.  Objective:  VS:  HT:     WT:    BMI:      BP:    HR: bpm   TEMP: ( )   RESP:  Physical Exam Vitals signs and nursing note reviewed.  Constitutional:      General: She is not in acute distress.    Appearance: Normal appearance. She is well-developed.  HENT:     Head: Normocephalic and atraumatic.     Nose: Nose normal.     Mouth/Throat:     Mouth: Mucous membranes are moist.     Pharynx: Oropharynx is clear.  Eyes:     Conjunctiva/sclera: Conjunctivae normal.     Pupils: Pupils are equal, round, and reactive to light.  Neck:     Musculoskeletal: Normal range of motion and neck supple. No neck rigidity.  Cardiovascular:     Rate and Rhythm: Regular rhythm.  Pulmonary:     Effort: Pulmonary effort is normal. No respiratory distress.  Abdominal:     General: There is no distension.     Palpations: Abdomen is soft.     Tenderness: There is no guarding.  Musculoskeletal:     Right lower leg: No edema.     Left lower leg: No edema.      Comments: Exam nation of the cervical spine shows forward flexed cervical spine with focal pain generating trigger points in the right levator scapula and trapezius as well as rhomboid and then on the left more than the levator scapula.  She has mild shoulder impingement on the right more than left.  Negative drop arm test.  She has no weakness of the upper extremities.  Lumbar spine exam shows  patient standing with forward flexed lumbar spine to a mild degree.  She ambulates without aid.  She has pain concordant with her low back pain with facet loading and extension and rotation.  She has no pain over the PSIS bilaterally she has good distal strength.  Lymphadenopathy:     Cervical: No cervical adenopathy.  Skin:    General: Skin is warm and dry.     Findings: No erythema or rash.  Neurological:     General: No focal deficit present.     Mental Status: She is alert and oriented to person, place, and time.     Motor: No abnormal muscle tone.     Coordination: Coordination normal.     Gait: Gait normal.  Psychiatric:        Mood and Affect: Mood normal.        Behavior: Behavior normal.        Thought Content: Thought content normal.     Ortho Exam Imaging: No results found.  Past Medical/Family/Surgical/Social History: Medications & Allergies reviewed per EMR, new medications updated. Patient Active Problem List   Diagnosis Date Noted   Post laminectomy syndrome 02/18/2018   Radiculopathy due to lumbar intervertebral disc disorder 02/18/2018   Spinal stenosis of lumbar region with neurogenic claudication 02/18/2018   Anxiety state, unspecified 09/04/2011   Mood disorder in conditions classified elsewhere 10/05/2009   Disequilibrium 08/24/2009   Elevated blood-pressure reading without diagnosis of hypertension 08/24/2009   Vitamin D deficiency 08/24/2009   Contact dermatitis and other eczema due to plants (except food) 07/28/2009   DDD (degenerative disc disease),  lumbosacral 12/08/2007   Eustachian tube dysfunction 12/08/2007   Otalgia 12/08/2007   Past Medical History:  Diagnosis Date   Depressive disorder, not elsewhere classified    situational (divorce, work stress)   Herpes simplex labialis    Osteopenia    Seasonal allergies    Family History  Problem Relation Age of Onset   Heart disease Mother        diagnosed in her 79's.  s/p CABG   Cancer Mother 59       breast cancer   Breast cancer Mother    Alcohol abuse Father    Diabetes Father        refused to be treated   COPD Sister    Diabetes Sister    Diabetes Brother    Diabetes Brother    Alcohol abuse Brother        stopped when diagnosed with DM   Cerebral aneurysm Maternal Aunt    Heart disease Maternal Uncle    Cancer Maternal Grandmother        lymphoma   Heart disease Maternal Grandfather    Past Surgical History:  Procedure Laterality Date   BILATERAL SALPINGOOPHORECTOMY  2004   done with hysterectomy   BUNIONECTOMY  2007   and hammertoe surgery L   BUNIONECTOMY     R in [place], [place]  x2   [place]  2004   LUMBAR [place]     Social History   Occupational History   Occupation: International aid/development worker (for physicians)    Employer: [employer]  Tobacco Use   Smoking status: Never Smoker   Smokeless tobacco: Never Used  Substance and Sexual Activity   Alcohol use: Yes    Comment: 2 glasses of wine per evening.   Drug use: No   Sexual activity: Not on  file

## 2019-02-10 ENCOUNTER — Encounter: Payer: Self-pay | Admitting: Physical Medicine and Rehabilitation

## 2019-02-10 DIAGNOSIS — M7918 Myalgia, other site: Secondary | ICD-10-CM

## 2019-02-10 DIAGNOSIS — M419 Scoliosis, unspecified: Secondary | ICD-10-CM | POA: Diagnosis not present

## 2019-02-10 DIAGNOSIS — M25512 Pain in left shoulder: Secondary | ICD-10-CM | POA: Diagnosis not present

## 2019-02-10 DIAGNOSIS — M961 Postlaminectomy syndrome, not elsewhere classified: Secondary | ICD-10-CM | POA: Diagnosis not present

## 2019-02-10 DIAGNOSIS — M48062 Spinal stenosis, lumbar region with neurogenic claudication: Secondary | ICD-10-CM | POA: Diagnosis not present

## 2019-02-10 DIAGNOSIS — M25511 Pain in right shoulder: Secondary | ICD-10-CM | POA: Diagnosis not present

## 2019-02-10 DIAGNOSIS — G894 Chronic pain syndrome: Secondary | ICD-10-CM | POA: Diagnosis not present

## 2019-02-10 DIAGNOSIS — G8929 Other chronic pain: Secondary | ICD-10-CM | POA: Diagnosis not present

## 2019-02-10 MED ORDER — TRIAMCINOLONE ACETONIDE 40 MG/ML IJ SUSP
20.0000 mg | INTRAMUSCULAR | Status: AC | PRN
  Administered 2019-02-10: 06:00:00 20 mg via INTRAMUSCULAR

## 2019-02-10 MED ORDER — LIDOCAINE HCL 1 % IJ SOLN
3.0000 mL | INTRAMUSCULAR | Status: AC | PRN
  Administered 2019-02-10: 3 mL

## 2019-02-25 ENCOUNTER — Encounter: Payer: Self-pay | Admitting: Physical Medicine and Rehabilitation

## 2019-02-25 ENCOUNTER — Other Ambulatory Visit: Payer: Self-pay

## 2019-02-25 ENCOUNTER — Ambulatory Visit (INDEPENDENT_AMBULATORY_CARE_PROVIDER_SITE_OTHER): Payer: PPO | Admitting: Physical Medicine and Rehabilitation

## 2019-02-25 ENCOUNTER — Ambulatory Visit: Payer: Self-pay

## 2019-02-25 VITALS — BP 145/99 | HR 78

## 2019-02-25 DIAGNOSIS — I10 Essential (primary) hypertension: Secondary | ICD-10-CM | POA: Diagnosis not present

## 2019-02-25 DIAGNOSIS — M47816 Spondylosis without myelopathy or radiculopathy, lumbar region: Secondary | ICD-10-CM | POA: Diagnosis not present

## 2019-02-25 DIAGNOSIS — E559 Vitamin D deficiency, unspecified: Secondary | ICD-10-CM | POA: Diagnosis not present

## 2019-02-25 DIAGNOSIS — E782 Mixed hyperlipidemia: Secondary | ICD-10-CM | POA: Diagnosis not present

## 2019-02-25 MED ORDER — METHYLPREDNISOLONE ACETATE 80 MG/ML IJ SUSP
80.0000 mg | Freq: Once | INTRAMUSCULAR | Status: AC
  Administered 2019-02-25: 80 mg

## 2019-02-25 NOTE — Progress Notes (Signed)
 .  Numeric Pain Rating Scale and Functional Assessment Average Pain 6   In the last MONTH (on 0-10 scale) has pain interfered with the following?  1. General activity like being  able to carry out your everyday physical activities such as walking, climbing stairs, carrying groceries, or moving a chair?  Rating(6)   +Driver, -BT, -Dye Allergies.  

## 2019-03-03 DIAGNOSIS — R252 Cramp and spasm: Secondary | ICD-10-CM | POA: Diagnosis not present

## 2019-03-03 DIAGNOSIS — E782 Mixed hyperlipidemia: Secondary | ICD-10-CM | POA: Diagnosis not present

## 2019-03-18 DIAGNOSIS — H02831 Dermatochalasis of right upper eyelid: Secondary | ICD-10-CM | POA: Diagnosis not present

## 2019-03-18 DIAGNOSIS — H25813 Combined forms of age-related cataract, bilateral: Secondary | ICD-10-CM | POA: Diagnosis not present

## 2019-03-18 DIAGNOSIS — H524 Presbyopia: Secondary | ICD-10-CM | POA: Diagnosis not present

## 2019-03-18 DIAGNOSIS — H02834 Dermatochalasis of left upper eyelid: Secondary | ICD-10-CM | POA: Diagnosis not present

## 2019-03-18 DIAGNOSIS — H11152 Pinguecula, left eye: Secondary | ICD-10-CM | POA: Diagnosis not present

## 2019-03-22 DIAGNOSIS — I10 Essential (primary) hypertension: Secondary | ICD-10-CM | POA: Diagnosis not present

## 2019-03-22 DIAGNOSIS — K219 Gastro-esophageal reflux disease without esophagitis: Secondary | ICD-10-CM | POA: Diagnosis not present

## 2019-03-31 ENCOUNTER — Ambulatory Visit (INDEPENDENT_AMBULATORY_CARE_PROVIDER_SITE_OTHER): Payer: PPO | Admitting: Physical Medicine and Rehabilitation

## 2019-03-31 ENCOUNTER — Encounter: Payer: Self-pay | Admitting: Physical Medicine and Rehabilitation

## 2019-03-31 ENCOUNTER — Ambulatory Visit: Payer: Self-pay

## 2019-03-31 ENCOUNTER — Other Ambulatory Visit: Payer: Self-pay

## 2019-03-31 VITALS — BP 128/82 | HR 90

## 2019-03-31 DIAGNOSIS — G8929 Other chronic pain: Secondary | ICD-10-CM

## 2019-03-31 DIAGNOSIS — M47816 Spondylosis without myelopathy or radiculopathy, lumbar region: Secondary | ICD-10-CM | POA: Diagnosis not present

## 2019-03-31 NOTE — Progress Notes (Signed)
Kara [name] - 68 y.o. female MRN [id number]  Date of birth: 02/23/1951  Office Visit Note: Visit Date: 02/25/2019 PCP: Fanny Bien, MD Referred by: Fanny Bien, MD  Subjective: Chief Complaint  Patient presents with  . Lower Back - Pain  . Left Thigh - Pain   HPI:  Kara [name] is a 68 y.o. female who comes in today For planned bilateral medial branch blocks L3-4 and L4-5 for worsening axial low back pain worse with bending squatting and going from sit to stand and standing.  Today she is having a little bit of pain on the outside of the thigh but it only goes to the thigh not past the knee.  Patient's had prior history of lumbar surgery.  Please see our prior notes for further details and justification.  This would be done as a double block paradigm looking at potential for radiofrequency ablation.  Even though this was explained to her fully last time probably spent 10 minutes talking just about the procedure itself she came in today thinking we were going to do the ablation.  ROS Otherwise per HPI.  Assessment & Plan: Visit Diagnoses:  1. Spondylosis without myelopathy or radiculopathy, lumbar region     Plan: No additional findings.   Meds & Orders:  Meds ordered this encounter  Medications  . methylPREDNISolone acetate (DEPO-MEDROL) injection 80 mg    Orders Placed This Encounter  Procedures  . Facet Injection  . XR C-ARM NO REPORT    Follow-up: Return for Review Pain Diary.   Procedures: No procedures performed  Lumbar Diagnostic Facet Joint Nerve Block with Fluoroscopic Guidance   Patient: Kara [name]      Date of Birth: 1950-07-20 MRN: [id number] PCP: Fanny Bien, MD      Visit Date: 02/25/2019   Universal Protocol:    Date/Time: 11/18/205:08 AM  Consent Given By: the patient  Position: PRONE  Additional Comments: Vital signs were monitored before and after the procedure. Patient was prepped and draped in  the usual sterile fashion. The correct patient, procedure, and site was verified.   Injection Procedure Details:  Procedure Site One Meds Administered:  Meds ordered this encounter  Medications  . methylPREDNISolone acetate (DEPO-MEDROL) injection 80 mg     Laterality: Bilateral  Location/Site:  L3-L4 L4-L5  Needle size: 22 ga.  Needle type:spinal  Needle Placement: Oblique pedical  Findings:   -Comments: There was excellent flow of contrast along the articular pillars without intravascular flow.  Procedure Details: The fluoroscope beam is vertically oriented in AP and then obliqued 15 to 20 degrees to the ipsilateral side of the desired nerve to achieve the "Scotty dog" appearance.  The skin over the target area of the junction of the superior articulating process and the transverse process (sacral ala if blocking the L5 dorsal rami) was locally anesthetized with a 1 ml volume of 1% Lidocaine without Epinephrine.  The spinal needle was inserted and advanced in a trajectory view down to the target.   After contact with periosteum and negative aspirate for blood and CSF, correct placement without intravascular or epidural spread was confirmed by injecting 0.5 ml. of Isovue-250.  A spot radiograph was obtained of this image.    Next, a 0.5 ml. volume of the injectate described above was injected. The needle was then redirected to the other facet joint nerves mentioned above if needed.  Prior to the procedure, the patient was given a Pain Diary which was  completed for baseline measurements.  After the procedure, the patient rated their pain every 30 minutes and will continue rating at this frequency for a total of 5 hours.  The patient has been asked to complete the Diary and return to [place] by mail, fax or hand delivered as soon as possible.   Additional Comments:  The patient tolerated the procedure well Dressing: 2 x 2 sterile gauze and Band-Aid    Post-procedure details:  Patient was observed during the procedure. Post-procedure instructions were reviewed.  Patient left the clinic in stable condition.   Clinical History: MRI LUMBAR SPINE WITHOUT CONTRAST  TECHNIQUE: Multiplanar, multisequence MR imaging of the lumbar spine was performed. No intravenous contrast was administered.  COMPARISON:  09/05/2016  FINDINGS: Segmentation: 5 lumbar type vertebral bodies when compared to prior radiography.  Alignment:  Levoscoliosis and mild L4-5 anterolisthesis.  Vertebrae: Marrow edema about the right aspect of the L4-5 disc space, considered degenerative and a change from 2018. No fracture, discitis, or aggressive bone lesion  Conus medullaris and cauda equina: Conus extends to the L1-2 level. Conus and cauda equina appear normal.  Paraspinal and other soft tissues: Negative  Disc levels:  T12- L1: Tiny central protrusion.  L1-L2: Small central protrusion. Minor facet spurring. No impingement  L2-L3: Spondylosis with disc narrowing and bulging. Patent canal and foramina  L3-L4: Disc narrowing and bulging with endplate ridging. Mild posterior element hypertrophy. Moderate spinal stenosis with asymmetric right subarticular recess narrowing. Moderate right foraminal narrowing. Right foraminal protrusion that is likely subsided from prior, but does touch the right L3 nerve root without deformity.  L4-L5: Spondylosis and endplate degeneration with right preferential bulging. There is spinal stenosis without neural compression status post laminotomy. Right foraminal stenosis with L4 root distortion.  L5-S1:Asymmetric leftward disc narrowing far-lateral spurring. Mild facet spurring. Left foraminal impingement with L5 root distortion due to disc height loss and spur.  IMPRESSION: 1. Multilevel disc and facet degeneration with levoscoliosis. Degenerative endplate edema is newly seen at L4-5, new from 2018. 2. L5-S1 chronic left  foraminal compression. 3. L3-4 and L4-5 moderate right foraminal impingement. 4. L3-4 moderate spinal stenosis. There is asymmetric right subarticular recess narrowing that could affect the L4 nerve root. 5. L4-5 laminotomy with patent canal.   Electronically Signed   By: Monte Fantasia M.D.   On: 02/09/2018 11:29     Objective:  VS:  HT:    WT:   BMI:     BP:(!) 145/99  HR:78bpm  TEMP: ( )  RESP:  Physical Exam  Ortho Exam Imaging: No results found.

## 2019-03-31 NOTE — Procedures (Signed)
Lumbar Diagnostic Facet Joint Nerve Block with Fluoroscopic Guidance   Patient: Kara [name]      Date of Birth: 1950/08/06 MRN: [id number] PCP: Fanny Bien, MD      Visit Date: 02/25/2019   Universal Protocol:    Date/Time: 11/18/205:08 AM  Consent Given By: the patient  Position: PRONE  Additional Comments: Vital signs were monitored before and after the procedure. Patient was prepped and draped in the usual sterile fashion. The correct patient, procedure, and site was verified.   Injection Procedure Details:  Procedure Site One Meds Administered:  Meds ordered this encounter  Medications  . methylPREDNISolone acetate (DEPO-MEDROL) injection 80 mg     Laterality: Bilateral  Location/Site:  L3-L4 L4-L5  Needle size: 22 ga.  Needle type:spinal  Needle Placement: Oblique pedical  Findings:   -Comments: There was excellent flow of contrast along the articular pillars without intravascular flow.  Procedure Details: The fluoroscope beam is vertically oriented in AP and then obliqued 15 to 20 degrees to the ipsilateral side of the desired nerve to achieve the "Scotty dog" appearance.  The skin over the target area of the junction of the superior articulating process and the transverse process (sacral ala if blocking the L5 dorsal rami) was locally anesthetized with a 1 ml volume of 1% Lidocaine without Epinephrine.  The spinal needle was inserted and advanced in a trajectory view down to the target.   After contact with periosteum and negative aspirate for blood and CSF, correct placement without intravascular or epidural spread was confirmed by injecting 0.5 ml. of Isovue-250.  A spot radiograph was obtained of this image.    Next, a 0.5 ml. volume of the injectate described above was injected. The needle was then redirected to the other facet joint nerves mentioned above if needed.  Prior to the procedure, the patient was given a Pain Diary which was  completed for baseline measurements.  After the procedure, the patient rated their pain every 30 minutes and will continue rating at this frequency for a total of 5 hours.  The patient has been asked to complete the Diary and return to [place] by mail, fax or hand delivered as soon as possible.   Additional Comments:  The patient tolerated the procedure well Dressing: 2 x 2 sterile gauze and Band-Aid    Post-procedure details: Patient was observed during the procedure. Post-procedure instructions were reviewed.  Patient left the clinic in stable condition.

## 2019-03-31 NOTE — Progress Notes (Signed)
  Numeric Pain Rating Scale and Functional Assessment Average Pain 5   In the last MONTH (on 0-10 scale) has pain interfered with the following?  1. General activity like being  able to carry out your everyday physical activities such as walking, climbing stairs, carrying groceries, or moving a chair?  Rating(7)   +Driver, -BT, -Dye Allergies. 

## 2019-04-06 ENCOUNTER — Telehealth: Payer: Self-pay | Admitting: Physical Medicine and Rehabilitation

## 2019-04-06 NOTE — Telephone Encounter (Signed)
See below. Needs auth and scheduling with valium.

## 2019-04-06 NOTE — Telephone Encounter (Signed)
Need RFA with valium

## 2019-04-07 ENCOUNTER — Telehealth: Payer: Self-pay | Admitting: *Deleted

## 2019-04-12 NOTE — Telephone Encounter (Signed)
done

## 2019-04-12 NOTE — Progress Notes (Signed)
Kara [name] - 68 y.o. female MRN [id number]  Date of birth: [birth year]  Office Visit Note: Visit Date: 03/31/2019 PCP: Fanny Bien, MD Referred by: Fanny Bien, MD  Subjective: Chief Complaint  Patient presents with  . Lower Back - Pain   HPI: Kara [name] is a 68 y.o. female who comes in today For second set of diagnostic medial branch blocks of the L3-4 and L4-5 facet joints.  Prior facet joint block was completed a few weeks ago and pain diary reveals good relief of symptoms temporarily during the anesthetic phase.  Her history is such that she has worsening axial low back pain worse with going from forward flexion to extension worse with standing and household chores.  She occasionally gets some referral in the left hip.  She has had physical therapy and she currently works on home exercise program and is avid with yoga.  She rates her pain as a 5 out of 10 but it does affect her daily living which was rated as a 7 out of 10.  She has had prior lumbar discectomy.  She has no sign of nerve compression or stenosis.  She has had this problem chronically long-term for many years.  She is recently moved to [facility].  We did give her the name of a physician at [facility] who is a physiatrist there.  We will complete second diagnostic block today with goal towards definitive treatment with radiofrequency ablation.  ROS Otherwise per HPI.  Assessment & Plan: Visit Diagnoses:  1. Spondylosis without myelopathy or radiculopathy, lumbar region   2. Chronic bilateral low back pain without sciatica     Plan: No additional findings.   Meds & Orders: No orders of the defined types were placed in this encounter.   Orders Placed This Encounter  Procedures  . XR C-ARM NO REPORT    Follow-up: Return for Review Pain Diary.   Procedures: Lumbar Diagnostic Facet Joint Nerve Block with Fluoroscopic Guidance   Patient: Kara [name]       Date of Birth: [birth year] MRN: [id number] PCP: Fanny Bien, MD      Visit Date: 03/31/2019   Universal Protocol:    Date/Time: 04/12/2011:45 PM  Consent Given By: the patient  Position: PRONE  Additional Comments: Vital signs were monitored before and after the procedure. Patient was prepped and draped in the usual sterile fashion. The correct patient, procedure, and site was verified.   Injection Procedure Details:  Procedure Site One Meds Administered: 0.5% Marcaine  Laterality: Bilateral  Location/Site:  L3-L4 L4-L5  Needle size: 22 ga.  Needle type:spinal  Needle Placement: Oblique pedical  Findings:   -Comments: There was excellent flow of contrast along the articular pillars without intravascular flow.  Procedure Details: The fluoroscope beam is vertically oriented in AP and then obliqued 15 to 20 degrees to the ipsilateral side of the desired nerve to achieve the "Scotty dog" appearance.  The skin over the target area of the junction of the superior articulating process and the transverse process (sacral ala if blocking the L5 dorsal rami) was locally anesthetized with a 1 ml volume of 1% Lidocaine without Epinephrine.  The spinal needle was inserted and advanced in a trajectory view down to the target.   After contact with periosteum and negative aspirate for blood and CSF, correct placement without intravascular or epidural spread was confirmed by injecting 0.5 ml. of Isovue-250.  A spot radiograph was obtained of this image.  Next, a 0.5 ml. volume of the injectate described above was injected. The needle was then redirected to the other facet joint nerves mentioned above if needed.  Prior to the procedure, the patient was given a Pain Diary which was completed for baseline measurements.  After the procedure, the patient rated their pain every 30 minutes and will continue rating at this frequency for a total of 5 hours.  The patient has been asked to  complete the Diary and return to [place] by mail, fax or hand delivered as soon as possible.   Additional Comments:  The patient tolerated the procedure well Dressing: 2 x 2 sterile gauze and Band-Aid    Post-procedure details: Patient was observed during the procedure. Post-procedure instructions were reviewed.  Patient left the clinic in stable condition.  No notes on file   Clinical History: MRI LUMBAR SPINE WITHOUT CONTRAST  TECHNIQUE: Multiplanar, multisequence MR imaging of the lumbar spine was performed. No intravenous contrast was administered.  COMPARISON:  09/05/2016  FINDINGS: Segmentation: 5 lumbar type vertebral bodies when compared to prior radiography.  Alignment:  Levoscoliosis and mild L4-5 anterolisthesis.  Vertebrae: Marrow edema about the right aspect of the L4-5 disc space, considered degenerative and a change from 2018. No fracture, discitis, or aggressive bone lesion  Conus medullaris and cauda equina: Conus extends to the L1-2 level. Conus and cauda equina appear normal.  Paraspinal and other soft tissues: Negative  Disc levels:  T12- L1: Tiny central protrusion.  L1-L2: Small central protrusion. Minor facet spurring. No impingement  L2-L3: Spondylosis with disc narrowing and bulging. Patent canal and foramina  L3-L4: Disc narrowing and bulging with endplate ridging. Mild posterior element hypertrophy. Moderate spinal stenosis with asymmetric right subarticular recess narrowing. Moderate right foraminal narrowing. Right foraminal protrusion that is likely subsided from prior, but does touch the right L3 nerve root without deformity.  L4-L5: Spondylosis and endplate degeneration with right preferential bulging. There is spinal stenosis without neural compression status post laminotomy. Right foraminal stenosis with L4 root distortion.  L5-S1:Asymmetric leftward disc narrowing far-lateral spurring. Mild facet spurring. Left  foraminal impingement with L5 root distortion due to disc height loss and spur.  IMPRESSION: 1. Multilevel disc and facet degeneration with levoscoliosis. Degenerative endplate edema is newly seen at L4-5, new from 2018. 2. L5-S1 chronic left foraminal compression. 3. L3-4 and L4-5 moderate right foraminal impingement. 4. L3-4 moderate spinal stenosis. There is asymmetric right subarticular recess narrowing that could affect the L4 nerve root. 5. L4-5 laminotomy with patent canal.   Electronically Signed   By: Monte Fantasia M.D.   On: 02/09/2018 11:29   She reports that she has never smoked. She has never used smokeless tobacco. No results for input(s): HGBA1C, LABURIC in the last 8760 hours.  Objective:  VS:  HT:    WT:   BMI:     BP:128/82  HR:90bpm  TEMP: ( )  RESP:  Physical Exam Constitutional:      General: She is not in acute distress.    Appearance: Normal appearance. She is not ill-appearing.  HENT:     Head: Normocephalic and atraumatic.     Right Ear: External ear normal.     Left Ear: External ear normal.  Eyes:     Extraocular Movements: Extraocular movements intact.  Cardiovascular:     Rate and Rhythm: Normal rate.     Pulses: Normal pulses.  Musculoskeletal:     Right lower leg: No edema.     Left  lower leg: No edema.     Comments: Patient has good distal strength with no pain over the greater trochanters.  No clonus or focal weakness.  She has pain with extension and facet loading.  No trigger points.  Skin:    Findings: No erythema, lesion or rash.  Neurological:     General: No focal deficit present.     Mental Status: She is alert and oriented to person, place, and time.     Sensory: No sensory deficit.     Motor: No weakness or abnormal muscle tone.     Coordination: Coordination normal.  Psychiatric:        Mood and Affect: Mood normal.        Behavior: Behavior normal.     Ortho Exam Imaging: No results found.  Past  Medical/Family/Surgical/Social History: Medications & Allergies reviewed per EMR, new medications updated. Patient Active Problem List   Diagnosis Date Noted  . Post laminectomy syndrome 02/18/2018  . Radiculopathy due to lumbar intervertebral disc disorder 02/18/2018  . Spinal stenosis of lumbar region with neurogenic claudication 02/18/2018  . Anxiety state, unspecified 09/04/2011  . Mood disorder in conditions classified elsewhere 10/05/2009  . Disequilibrium 08/24/2009  . Elevated blood-pressure reading without diagnosis of hypertension 08/24/2009  . Vitamin D deficiency 08/24/2009  . Contact dermatitis and other eczema due to plants (except food) 07/28/2009  . DDD (degenerative disc disease), lumbosacral 12/08/2007  . Eustachian tube dysfunction 12/08/2007  . Otalgia 12/08/2007   Past Medical History:  Diagnosis Date  . Depressive disorder, not elsewhere classified    situational (divorce, work stress)  . Herpes simplex labialis   . Osteopenia   . Seasonal allergies    Family History  Problem Relation Age of Onset  . Heart disease Mother        diagnosed in her 43's.  s/p CABG  . Cancer Mother 1       breast cancer  . Breast cancer Mother   . Alcohol abuse Father   . Diabetes Father        refused to be treated  . COPD Sister   . Diabetes Sister   . Diabetes Brother   . Diabetes Brother   . Alcohol abuse Brother        stopped when diagnosed with DM  . Cerebral aneurysm Maternal Aunt   . Heart disease Maternal Uncle   . Cancer Maternal Grandmother        lymphoma  . Heart disease Maternal Grandfather    Past Surgical History:  Procedure Laterality Date  . BILATERAL SALPINGOOPHORECTOMY  2004   done with hysterectomy  . BUNIONECTOMY  2007   and hammertoe surgery L  . BUNIONECTOMY     R in [place], [place]  . CESAREAN SECTION  x2   [place]  . LAPAROSCOPIC ASSISTED VAGINAL HYSTERECTOMY  2004  . LUMBAR DISC SURGERY     Social History   Occupational History   . Occupation: International aid/development worker (for physicians)    Employer: [employer]  Tobacco Use  . Smoking status: Never Smoker  . Smokeless tobacco: Never Used  Substance and Sexual Activity  . Alcohol use: Yes    Comment: 2 glasses of wine per evening.  . Drug use: No  . Sexual activity: Not on file

## 2019-04-14 NOTE — Telephone Encounter (Signed)
Sent fax for PA on 04/12/2019. Case is pending.

## 2019-04-20 NOTE — Telephone Encounter (Signed)
Pt has been approved for Bil L3-4, L4-5 RFA, our next available appt will be 05/13/2019 and patient is switching insurance in January 2021. I advised pt that we have to schedule each side on a different date. Pt asked are their any other recommendations? Please Advise.

## 2019-04-20 NOTE — Telephone Encounter (Signed)
We can make exception and do both sides at same time since she travels and insurance issue, will need valium.

## 2019-04-20 NOTE — Telephone Encounter (Signed)
Pt is scheduled for both side same day 05/13/2019. Pt would like to have valium prior to procedure. Pt pharmacy is [facility] in [place], [place]. Please Advise.

## 2019-04-20 NOTE — Telephone Encounter (Signed)
Spoke with Stormy Card from [employer] and she states [phone] and [phone] Auth# [id number] effective dates 05/03/2019-08/01/19

## 2019-04-22 ENCOUNTER — Other Ambulatory Visit: Payer: Self-pay | Admitting: Physical Medicine and Rehabilitation

## 2019-04-22 DIAGNOSIS — B001 Herpesviral vesicular dermatitis: Secondary | ICD-10-CM | POA: Diagnosis not present

## 2019-04-22 DIAGNOSIS — F411 Generalized anxiety disorder: Secondary | ICD-10-CM | POA: Diagnosis not present

## 2019-04-22 DIAGNOSIS — I10 Essential (primary) hypertension: Secondary | ICD-10-CM | POA: Diagnosis not present

## 2019-04-22 DIAGNOSIS — Z6828 Body mass index (BMI) 28.0-28.9, adult: Secondary | ICD-10-CM | POA: Diagnosis not present

## 2019-04-22 DIAGNOSIS — E785 Hyperlipidemia, unspecified: Secondary | ICD-10-CM | POA: Diagnosis not present

## 2019-04-22 DIAGNOSIS — M48062 Spinal stenosis, lumbar region with neurogenic claudication: Secondary | ICD-10-CM | POA: Diagnosis not present

## 2019-04-22 MED ORDER — DIAZEPAM 5 MG PO TABS: ORAL_TABLET | ORAL | 0 refills | Status: AC

## 2019-04-22 NOTE — Telephone Encounter (Signed)
done

## 2019-04-22 NOTE — Progress Notes (Signed)
Pre-procedure diazepam ordered for pre-operative anxiety.  

## 2019-05-13 ENCOUNTER — Ambulatory Visit: Payer: Self-pay

## 2019-05-13 ENCOUNTER — Encounter: Payer: Self-pay | Admitting: Physical Medicine and Rehabilitation

## 2019-05-13 ENCOUNTER — Ambulatory Visit: Payer: PPO | Admitting: Physical Medicine and Rehabilitation

## 2019-05-13 ENCOUNTER — Other Ambulatory Visit: Payer: Self-pay

## 2019-05-13 VITALS — BP 148/91 | HR 85

## 2019-05-13 DIAGNOSIS — M545 Low back pain, unspecified: Secondary | ICD-10-CM

## 2019-05-13 DIAGNOSIS — M47816 Spondylosis without myelopathy or radiculopathy, lumbar region: Secondary | ICD-10-CM

## 2019-05-13 DIAGNOSIS — G8929 Other chronic pain: Secondary | ICD-10-CM

## 2019-05-13 MED ORDER — METHYLPREDNISOLONE ACETATE 80 MG/ML IJ SUSP
80.0000 mg | Freq: Once | INTRAMUSCULAR | Status: AC
  Administered 2019-05-13: 80 mg

## 2019-05-13 NOTE — Progress Notes (Signed)
 .  Numeric Pain Rating Scale and Functional Assessment Average Pain 8   In the last MONTH (on 0-10 scale) has pain interfered with the following?  1. General activity like being  able to carry out your everyday physical activities such as walking, climbing stairs, carrying groceries, or moving a chair?  Rating(8)   +Driver, -BT, -Dye Allergies.  

## 2019-05-26 NOTE — Progress Notes (Signed)
Kara [name] - 69 y.o. female MRN [id number]  Date of birth: [birth year]  Office Visit Note: Visit Date: 05/13/2019 PCP: Fanny Bien, MD Referred by: Fanny Bien, MD  Subjective: Chief Complaint  Patient presents with  . Lower Back - Pain   HPI: Kara [name] is a 69 y.o. female who comes in today For planned bilateral radiofrequency ablation of the L3-4 and L4-5 facet joints.  Patient has axial low back pain worse with standing.  On exam she has pain with facet loading concordant with her low back pain.  She has some focal trigger points but not concordant pain.  No pain with hip rotation.  She has MRI findings of facet arthropathy at L3-4 and L4-5.  No significant canal stenosis.  Prior lumbar discectomy remotely.  She has had physical therapy which is documented.  She has had medication management.  Has failed conservative care otherwise.  She has had double diagnostic medial branch blocks with pain diary showing more than 50% relief.  ROS Otherwise per HPI.  Assessment & Plan: Visit Diagnoses:  1. Spondylosis without myelopathy or radiculopathy, lumbar region   2. Chronic bilateral low back pain without sciatica     Plan: No additional findings.   Meds & Orders:  Meds ordered this encounter  Medications  . methylPREDNISolone acetate (DEPO-MEDROL) injection 80 mg    Orders Placed This Encounter  Procedures  . Radiofrequency,Lumbar  . XR C-ARM NO REPORT    Follow-up: Return if symptoms worsen or fail to improve.   Procedures: No procedures performed  Lumbar Facet Joint Nerve Denervation  Patient: Kara [name]      Date of Birth: August 17, 1950 MRN: [id number] PCP: Fanny Bien, MD      Visit Date: 05/13/2019   Universal Protocol:    Date/Time: 01/13/212:56 PM  Consent Given By: the patient  Position: PRONE  Additional Comments: Vital signs were monitored before and after the procedure. Patient was prepped and draped in  the usual sterile fashion. The correct patient, procedure, and site was verified.   Injection Procedure Details:  Procedure Site One Meds Administered:  Meds ordered this encounter  Medications  . methylPREDNISolone acetate (DEPO-MEDROL) injection 80 mg     Laterality: Bilateral  Location/Site:  L3-L4 L4-L5  Needle size: 18 G  Needle type: Radiofrequency cannula  Needle Placement: Along juncture of superior articular process and transverse pocess  Findings:  -Comments:  Procedure Details: For each desired target nerve, the corresponding transverse process (sacral ala for the L5 dorsal rami) was identified and the fluoroscope was positioned to square off the endplates of the corresponding vertebral body to achieve a true AP midline view.  The beam was then obliqued 15 to 20 degrees and caudally tilted 15 to 20 degrees to line up a trajectory along the target nerves. The skin over the target of the junction of superior articulating process and transverse process (sacral ala for the L5 dorsal rami) was infiltrated with 55ml of 1% Lidocaine without Epinephrine.  The 18 gauge 90mm active tip outer cannula was advanced in trajectory view to the target.  This procedure was repeated for each target nerve.  Then, for all levels, the outer cannula placement was fine-tuned and the position was then confirmed with bi-planar imaging.    Test stimulation was done both at sensory and motor levels to ensure there was no radicular stimulation. The target tissues were then infiltrated with 1 ml of 1% Lidocaine without Epinephrine. Subsequently, a  percutaneous neurotomy was carried out for 90 seconds at 80 degrees Celsius.  After the completion of the lesion, 1 ml of injectate was delivered. It was then repeated for each facet joint nerve mentioned above. Appropriate radiographs were obtained to verify the probe placement during the neurotomy.   Additional Comments:  The patient tolerated the  procedure well Dressing: 2 x 2 sterile gauze and Band-Aid    Post-procedure details: Patient was observed during the procedure. Post-procedure instructions were reviewed.  Patient left the clinic in stable condition.       Clinical History: MRI LUMBAR SPINE WITHOUT CONTRAST  TECHNIQUE: Multiplanar, multisequence MR imaging of the lumbar spine was performed. No intravenous contrast was administered.  COMPARISON:  09/05/2016  FINDINGS: Segmentation: 5 lumbar type vertebral bodies when compared to prior radiography.  Alignment:  Levoscoliosis and mild L4-5 anterolisthesis.  Vertebrae: Marrow edema about the right aspect of the L4-5 disc space, considered degenerative and a change from 2018. No fracture, discitis, or aggressive bone lesion  Conus medullaris and cauda equina: Conus extends to the L1-2 level. Conus and cauda equina appear normal.  Paraspinal and other soft tissues: Negative  Disc levels:  T12- L1: Tiny central protrusion.  L1-L2: Small central protrusion. Minor facet spurring. No impingement  L2-L3: Spondylosis with disc narrowing and bulging. Patent canal and foramina  L3-L4: Disc narrowing and bulging with endplate ridging. Mild posterior element hypertrophy. Moderate spinal stenosis with asymmetric right subarticular recess narrowing. Moderate right foraminal narrowing. Right foraminal protrusion that is likely subsided from prior, but does touch the right L3 nerve root without deformity.  L4-L5: Spondylosis and endplate degeneration with right preferential bulging. There is spinal stenosis without neural compression status post laminotomy. Right foraminal stenosis with L4 root distortion.  L5-S1:Asymmetric leftward disc narrowing far-lateral spurring. Mild facet spurring. Left foraminal impingement with L5 root distortion due to disc height loss and spur.  IMPRESSION: 1. Multilevel disc and facet degeneration with  levoscoliosis. Degenerative endplate edema is newly seen at L4-5, new from 2018. 2. L5-S1 chronic left foraminal compression. 3. L3-4 and L4-5 moderate right foraminal impingement. 4. L3-4 moderate spinal stenosis. There is asymmetric right subarticular recess narrowing that could affect the L4 nerve root. 5. L4-5 laminotomy with patent canal.   Electronically Signed   By: Monte Fantasia M.D.   On: 02/09/2018 11:29   She reports that she has never smoked. She has never used smokeless tobacco. No results for input(s): HGBA1C, LABURIC in the last 8760 hours.  Objective:  VS:  HT:    WT:   BMI:     BP:(!) 148/91  HR:85bpm  TEMP: ( )  RESP:  Physical Exam  Ortho Exam Imaging: No results found.  Past Medical/Family/Surgical/Social History: Medications & Allergies reviewed per EMR, new medications updated. Patient Active Problem List   Diagnosis Date Noted  . Post laminectomy syndrome 02/18/2018  . Radiculopathy due to lumbar intervertebral disc disorder 02/18/2018  . Spinal stenosis of lumbar region with neurogenic claudication 02/18/2018  . Anxiety state, unspecified 09/04/2011  . Mood disorder in conditions classified elsewhere 10/05/2009  . Disequilibrium 08/24/2009  . Elevated blood-pressure reading without diagnosis of hypertension 08/24/2009  . Vitamin D deficiency 08/24/2009  . Contact dermatitis and other eczema due to plants (except food) 07/28/2009  . DDD (degenerative disc disease), lumbosacral 12/08/2007  . Eustachian tube dysfunction 12/08/2007  . Otalgia 12/08/2007   Past Medical History:  Diagnosis Date  . Depressive disorder, not elsewhere classified    situational (divorce,  work stress)  . Herpes simplex labialis   . Osteopenia   . Seasonal allergies    Family History  Problem Relation Age of Onset  . Heart disease Mother        diagnosed in her 60's.  s/p CABG  . Cancer Mother 34       breast cancer  . Breast cancer Mother   . Alcohol abuse  Father   . Diabetes Father        refused to be treated  . COPD Sister   . Diabetes Sister   . Diabetes Brother   . Diabetes Brother   . Alcohol abuse Brother        stopped when diagnosed with DM  . Cerebral aneurysm Maternal Aunt   . Heart disease Maternal Uncle   . Cancer Maternal Grandmother        lymphoma  . Heart disease Maternal Grandfather    Past Surgical History:  Procedure Laterality Date  . BILATERAL SALPINGOOPHORECTOMY  2004   done with hysterectomy  . BUNIONECTOMY  2007   and hammertoe surgery L  . BUNIONECTOMY     R in [place], [place]  . CESAREAN SECTION  x2   [place]  . LAPAROSCOPIC ASSISTED VAGINAL HYSTERECTOMY  2004  . LUMBAR DISC SURGERY     Social History   Occupational History  . Occupation: International aid/development worker (for physicians)    Employer: [employer]  Tobacco Use  . Smoking status: Never Smoker  . Smokeless tobacco: Never Used  Substance and Sexual Activity  . Alcohol use: Yes    Comment: 2 glasses of wine per evening.  . Drug use: No  . Sexual activity: Not on file

## 2019-05-26 NOTE — Procedures (Signed)
Lumbar Facet Joint Nerve Denervation  Patient: Kara [name]      Date of Birth: 12-10-1950 MRN: [id number] PCP: Fanny Bien, MD      Visit Date: 05/13/2019   Universal Protocol:    Date/Time: 01/13/212:56 PM  Consent Given By: the patient  Position: PRONE  Additional Comments: Vital signs were monitored before and after the procedure. Patient was prepped and draped in the usual sterile fashion. The correct patient, procedure, and site was verified.   Injection Procedure Details:  Procedure Site One Meds Administered:  Meds ordered this encounter  Medications  . methylPREDNISolone acetate (DEPO-MEDROL) injection 80 mg     Laterality: Bilateral  Location/Site:  L3-L4 L4-L5  Needle size: 18 G  Needle type: Radiofrequency cannula  Needle Placement: Along juncture of superior articular process and transverse pocess  Findings:  -Comments:  Procedure Details: For each desired target nerve, the corresponding transverse process (sacral ala for the L5 dorsal rami) was identified and the fluoroscope was positioned to square off the endplates of the corresponding vertebral body to achieve a true AP midline view.  The beam was then obliqued 15 to 20 degrees and caudally tilted 15 to 20 degrees to line up a trajectory along the target nerves. The skin over the target of the junction of superior articulating process and transverse process (sacral ala for the L5 dorsal rami) was infiltrated with 31ml of 1% Lidocaine without Epinephrine.  The 18 gauge 28mm active tip outer cannula was advanced in trajectory view to the target.  This procedure was repeated for each target nerve.  Then, for all levels, the outer cannula placement was fine-tuned and the position was then confirmed with bi-planar imaging.    Test stimulation was done both at sensory and motor levels to ensure there was no radicular stimulation. The target tissues were then infiltrated with 1 ml of 1%  Lidocaine without Epinephrine. Subsequently, a percutaneous neurotomy was carried out for 90 seconds at 80 degrees Celsius.  After the completion of the lesion, 1 ml of injectate was delivered. It was then repeated for each facet joint nerve mentioned above. Appropriate radiographs were obtained to verify the probe placement during the neurotomy.   Additional Comments:  The patient tolerated the procedure well Dressing: 2 x 2 sterile gauze and Band-Aid    Post-procedure details: Patient was observed during the procedure. Post-procedure instructions were reviewed.  Patient left the clinic in stable condition.
